# Patient Record
Sex: Female | Born: 1985 | Race: Black or African American | Hispanic: No | Marital: Married | Smoking: Never smoker
Health system: Southern US, Community
[De-identification: ages and names within clinical notes are randomized; demographics above are authoritative.]

## PROBLEM LIST (undated history)

## (undated) ENCOUNTER — Inpatient Hospital Stay (HOSPITAL_COMMUNITY): Payer: Self-pay

## (undated) DIAGNOSIS — R87619 Unspecified abnormal cytological findings in specimens from cervix uteri: Secondary | ICD-10-CM

## (undated) DIAGNOSIS — Z9289 Personal history of other medical treatment: Secondary | ICD-10-CM

## (undated) DIAGNOSIS — Z8619 Personal history of other infectious and parasitic diseases: Secondary | ICD-10-CM

## (undated) DIAGNOSIS — E041 Nontoxic single thyroid nodule: Secondary | ICD-10-CM

## (undated) DIAGNOSIS — IMO0002 Reserved for concepts with insufficient information to code with codable children: Secondary | ICD-10-CM

## (undated) HISTORY — PX: GYNECOLOGIC CRYOSURGERY: SHX857

## (undated) HISTORY — DX: Personal history of other infectious and parasitic diseases: Z86.19

## (undated) HISTORY — DX: Unspecified abnormal cytological findings in specimens from cervix uteri: R87.619

## (undated) HISTORY — PX: LEEP: SHX91

## (undated) HISTORY — DX: Personal history of other medical treatment: Z92.89

## (undated) HISTORY — DX: Nontoxic single thyroid nodule: E04.1

## (undated) HISTORY — PX: WISDOM TOOTH EXTRACTION: SHX21

## (undated) HISTORY — DX: Reserved for concepts with insufficient information to code with codable children: IMO0002

---

## 2008-10-13 ENCOUNTER — Emergency Department (HOSPITAL_COMMUNITY): Admission: EM | Admit: 2008-10-13 | Discharge: 2008-10-13 | Payer: Self-pay | Admitting: Emergency Medicine

## 2011-01-10 LAB — URINALYSIS, ROUTINE W REFLEX MICROSCOPIC
Hgb urine dipstick: NEGATIVE
Nitrite: NEGATIVE
Protein, ur: NEGATIVE mg/dL
Specific Gravity, Urine: 1.026 (ref 1.005–1.030)
Urobilinogen, UA: 1 mg/dL (ref 0.0–1.0)

## 2011-01-10 LAB — DIFFERENTIAL
Basophils Absolute: 0 10*3/uL (ref 0.0–0.1)
Lymphocytes Relative: 20 % (ref 12–46)
Monocytes Absolute: 0.4 10*3/uL (ref 0.1–1.0)
Neutro Abs: 3.9 10*3/uL (ref 1.7–7.7)
Neutrophils Relative %: 72 % (ref 43–77)

## 2011-01-10 LAB — CBC
HCT: 39.2 % (ref 36.0–46.0)
MCV: 91 fL (ref 78.0–100.0)
Platelets: 275 10*3/uL (ref 150–400)
RDW: 12.7 % (ref 11.5–15.5)

## 2011-01-10 LAB — URINE MICROSCOPIC-ADD ON

## 2011-01-10 LAB — COMPREHENSIVE METABOLIC PANEL
Albumin: 4.1 g/dL (ref 3.5–5.2)
BUN: 11 mg/dL (ref 6–23)
Chloride: 106 mEq/L (ref 96–112)
Creatinine, Ser: 1.07 mg/dL (ref 0.4–1.2)
Glucose, Bld: 91 mg/dL (ref 70–99)
Total Bilirubin: 0.6 mg/dL (ref 0.3–1.2)
Total Protein: 6.9 g/dL (ref 6.0–8.3)

## 2011-03-02 ENCOUNTER — Other Ambulatory Visit (HOSPITAL_COMMUNITY)
Admission: RE | Admit: 2011-03-02 | Discharge: 2011-03-02 | Disposition: A | Discharge status: Home / Self Care | Payer: PRIVATE HEALTH INSURANCE | Source: Ambulatory Visit | Attending: Obstetrics and Gynecology | Admitting: Obstetrics and Gynecology

## 2011-03-02 DIAGNOSIS — Z113 Encounter for screening for infections with a predominantly sexual mode of transmission: Secondary | ICD-10-CM | POA: Insufficient documentation

## 2011-03-02 DIAGNOSIS — Z01419 Encounter for gynecological examination (general) (routine) without abnormal findings: Secondary | ICD-10-CM | POA: Insufficient documentation

## 2011-09-21 ENCOUNTER — Other Ambulatory Visit: Payer: Self-pay | Admitting: Obstetrics and Gynecology

## 2011-09-21 ENCOUNTER — Ambulatory Visit (HOSPITAL_COMMUNITY)
Admission: RE | Admit: 2011-09-21 | Discharge: 2011-09-21 | Disposition: A | Discharge status: Home / Self Care | Payer: PRIVATE HEALTH INSURANCE | Source: Ambulatory Visit | Attending: Obstetrics and Gynecology | Admitting: Obstetrics and Gynecology

## 2011-09-21 DIAGNOSIS — Z3689 Encounter for other specified antenatal screening: Secondary | ICD-10-CM | POA: Insufficient documentation

## 2011-09-21 DIAGNOSIS — O99891 Other specified diseases and conditions complicating pregnancy: Secondary | ICD-10-CM | POA: Insufficient documentation

## 2011-09-21 DIAGNOSIS — R1032 Left lower quadrant pain: Secondary | ICD-10-CM | POA: Insufficient documentation

## 2011-10-19 ENCOUNTER — Ambulatory Visit (HOSPITAL_COMMUNITY)
Admission: RE | Admit: 2011-10-19 | Discharge: 2011-10-19 | Disposition: A | Discharge status: Home / Self Care | Payer: PRIVATE HEALTH INSURANCE | Source: Ambulatory Visit | Attending: Obstetrics and Gynecology | Admitting: Obstetrics and Gynecology

## 2011-10-19 ENCOUNTER — Other Ambulatory Visit (HOSPITAL_COMMUNITY): Payer: Self-pay | Admitting: Obstetrics and Gynecology

## 2011-10-19 ENCOUNTER — Other Ambulatory Visit: Payer: Self-pay

## 2011-10-19 DIAGNOSIS — R58 Hemorrhage, not elsewhere classified: Secondary | ICD-10-CM

## 2011-10-19 DIAGNOSIS — Z3689 Encounter for other specified antenatal screening: Secondary | ICD-10-CM | POA: Insufficient documentation

## 2011-10-20 ENCOUNTER — Other Ambulatory Visit: Payer: Self-pay | Admitting: Obstetrics and Gynecology

## 2011-10-20 DIAGNOSIS — E049 Nontoxic goiter, unspecified: Secondary | ICD-10-CM

## 2011-10-21 ENCOUNTER — Other Ambulatory Visit: Payer: Self-pay | Admitting: Obstetrics and Gynecology

## 2011-10-21 DIAGNOSIS — Z3689 Encounter for other specified antenatal screening: Secondary | ICD-10-CM

## 2011-10-21 DIAGNOSIS — R935 Abnormal findings on diagnostic imaging of other abdominal regions, including retroperitoneum: Secondary | ICD-10-CM

## 2011-10-26 ENCOUNTER — Other Ambulatory Visit: Payer: Self-pay | Admitting: Obstetrics and Gynecology

## 2011-10-26 ENCOUNTER — Other Ambulatory Visit: Payer: PRIVATE HEALTH INSURANCE

## 2011-10-26 DIAGNOSIS — O358XX0 Maternal care for other (suspected) fetal abnormality and damage, not applicable or unspecified: Secondary | ICD-10-CM

## 2011-10-28 ENCOUNTER — Ambulatory Visit (HOSPITAL_COMMUNITY)
Admission: RE | Admit: 2011-10-28 | Discharge: 2011-10-28 | Disposition: A | Discharge status: Home / Self Care | Payer: PRIVATE HEALTH INSURANCE | Source: Ambulatory Visit | Attending: Obstetrics and Gynecology | Admitting: Obstetrics and Gynecology

## 2011-10-28 ENCOUNTER — Other Ambulatory Visit: Payer: Self-pay | Admitting: Obstetrics and Gynecology

## 2011-10-28 ENCOUNTER — Encounter (HOSPITAL_COMMUNITY): Payer: Self-pay

## 2011-10-28 ENCOUNTER — Other Ambulatory Visit: Payer: Self-pay

## 2011-10-28 DIAGNOSIS — O358XX0 Maternal care for other (suspected) fetal abnormality and damage, not applicable or unspecified: Secondary | ICD-10-CM | POA: Insufficient documentation

## 2011-10-28 DIAGNOSIS — Z363 Encounter for antenatal screening for malformations: Secondary | ICD-10-CM | POA: Insufficient documentation

## 2011-10-28 DIAGNOSIS — Z1389 Encounter for screening for other disorder: Secondary | ICD-10-CM | POA: Insufficient documentation

## 2011-11-02 ENCOUNTER — Ambulatory Visit
Admission: RE | Admit: 2011-11-02 | Discharge: 2011-11-02 | Disposition: A | Discharge status: Home / Self Care | Payer: PRIVATE HEALTH INSURANCE | Source: Ambulatory Visit | Attending: Obstetrics and Gynecology | Admitting: Obstetrics and Gynecology

## 2011-11-02 DIAGNOSIS — E049 Nontoxic goiter, unspecified: Secondary | ICD-10-CM

## 2011-11-08 ENCOUNTER — Telehealth (HOSPITAL_COMMUNITY): Payer: Self-pay | Admitting: MS"

## 2011-11-08 NOTE — Telephone Encounter (Signed)
Left message for patient to return call.

## 2011-11-08 NOTE — Telephone Encounter (Signed)
Called [name] to discuss her MaterniT21, cell free fetal DNA testing.  We reviewed that these are within normal limits for chromosomes 21, 18 and 13.  We reviewed that this testing identifies > 99% of pregnancies with trisomy 64 and trisomy 66 and >91% with trisomy 14; the false positive rate is <0.1% for all conditions.  She understands that this testing does not identify all genetic conditions.  All questions were answered to her satisfaction, she was encouraged to call with additional questions or concerns.  Quinn Plowman, MS Patent attorney

## 2011-11-11 ENCOUNTER — Ambulatory Visit (HOSPITAL_COMMUNITY): Admission: RE | Admit: 2011-11-11 | Payer: PRIVATE HEALTH INSURANCE | Source: Ambulatory Visit

## 2012-04-09 LAB — OB RESULTS CONSOLE HEPATITIS B SURFACE ANTIGEN: Hepatitis B Surface Ag: NEGATIVE

## 2012-04-09 LAB — OB RESULTS CONSOLE RUBELLA ANTIBODY, IGM: Rubella: IMMUNE

## 2012-04-09 LAB — OB RESULTS CONSOLE ABO/RH

## 2012-04-09 LAB — OB RESULTS CONSOLE ANTIBODY SCREEN: Antibody Screen: NEGATIVE

## 2012-04-18 ENCOUNTER — Other Ambulatory Visit (HOSPITAL_COMMUNITY)
Admission: RE | Admit: 2012-04-18 | Discharge: 2012-04-18 | Disposition: A | Discharge status: Home / Self Care | Payer: BC Managed Care – PPO | Source: Ambulatory Visit | Attending: Obstetrics and Gynecology | Admitting: Obstetrics and Gynecology

## 2012-04-18 ENCOUNTER — Other Ambulatory Visit: Payer: Self-pay | Admitting: Obstetrics and Gynecology

## 2012-04-18 DIAGNOSIS — O09299 Supervision of pregnancy with other poor reproductive or obstetric history, unspecified trimester: Secondary | ICD-10-CM

## 2012-04-18 DIAGNOSIS — Z3682 Encounter for antenatal screening for nuchal translucency: Secondary | ICD-10-CM

## 2012-04-18 DIAGNOSIS — Z01419 Encounter for gynecological examination (general) (routine) without abnormal findings: Secondary | ICD-10-CM | POA: Insufficient documentation

## 2012-04-18 DIAGNOSIS — Z113 Encounter for screening for infections with a predominantly sexual mode of transmission: Secondary | ICD-10-CM | POA: Insufficient documentation

## 2012-04-18 LAB — OB RESULTS CONSOLE GC/CHLAMYDIA
Chlamydia: NEGATIVE
Gonorrhea: NEGATIVE

## 2012-05-02 ENCOUNTER — Encounter (HOSPITAL_COMMUNITY): Payer: Self-pay

## 2012-05-02 ENCOUNTER — Ambulatory Visit (HOSPITAL_COMMUNITY)
Admission: RE | Admit: 2012-05-02 | Discharge: 2012-05-02 | Disposition: A | Discharge status: Home / Self Care | Payer: BC Managed Care – PPO | Source: Ambulatory Visit | Attending: Obstetrics and Gynecology | Admitting: Obstetrics and Gynecology

## 2012-05-02 ENCOUNTER — Other Ambulatory Visit: Payer: Self-pay

## 2012-05-02 VITALS — BP 129/84 | HR 100 | Wt 190.0 lb

## 2012-05-02 DIAGNOSIS — Z3689 Encounter for other specified antenatal screening: Secondary | ICD-10-CM | POA: Insufficient documentation

## 2012-05-02 DIAGNOSIS — Z3682 Encounter for antenatal screening for nuchal translucency: Secondary | ICD-10-CM

## 2012-05-02 DIAGNOSIS — O3510X0 Maternal care for (suspected) chromosomal abnormality in fetus, unspecified, not applicable or unspecified: Secondary | ICD-10-CM | POA: Insufficient documentation

## 2012-05-02 DIAGNOSIS — O351XX0 Maternal care for (suspected) chromosomal abnormality in fetus, not applicable or unspecified: Secondary | ICD-10-CM | POA: Insufficient documentation

## 2012-05-02 DIAGNOSIS — O09299 Supervision of pregnancy with other poor reproductive or obstetric history, unspecified trimester: Secondary | ICD-10-CM

## 2012-05-02 NOTE — Progress Notes (Signed)
Patient seen today  for ultrasound.  See full report in [facility].  Alpha Gula, MD  Single IUP at 12 2/7 weeks NT of 1.2 mm noted.  Nasal bone was visualized. First trimester screen performed as above.  Recommend follow up ultrasound in 6 weeks for detailed anatomy.

## 2012-06-13 ENCOUNTER — Ambulatory Visit (HOSPITAL_COMMUNITY)
Admission: RE | Admit: 2012-06-13 | Discharge: 2012-06-13 | Disposition: A | Discharge status: Home / Self Care | Payer: BC Managed Care – PPO | Source: Ambulatory Visit | Attending: Obstetrics and Gynecology | Admitting: Obstetrics and Gynecology

## 2012-06-13 VITALS — BP 120/76 | HR 104 | Wt 188.0 lb

## 2012-06-13 DIAGNOSIS — O09299 Supervision of pregnancy with other poor reproductive or obstetric history, unspecified trimester: Secondary | ICD-10-CM | POA: Insufficient documentation

## 2012-06-13 DIAGNOSIS — Z363 Encounter for antenatal screening for malformations: Secondary | ICD-10-CM | POA: Insufficient documentation

## 2012-06-13 DIAGNOSIS — O358XX0 Maternal care for other (suspected) fetal abnormality and damage, not applicable or unspecified: Secondary | ICD-10-CM | POA: Insufficient documentation

## 2012-06-13 DIAGNOSIS — Z1389 Encounter for screening for other disorder: Secondary | ICD-10-CM | POA: Insufficient documentation

## 2012-06-13 NOTE — Progress Notes (Signed)
Single IUP at 17 5/7 weeks Normal detailed fetal anatomy; however, somewhat limited views of the fetal heart and face were obtained due to fetal position No markers associated with aneuploidy were noted Normal amniotic fluid volume  Recommend follow up ultrasound in 4 weeks to reevalaute fetal anatomy.

## 2012-06-22 ENCOUNTER — Inpatient Hospital Stay (HOSPITAL_COMMUNITY)
Admission: AD | Admit: 2012-06-22 | Discharge: 2012-06-22 | Disposition: A | Discharge status: Home / Self Care | Payer: BC Managed Care – PPO | Source: Ambulatory Visit | Attending: Obstetrics and Gynecology | Admitting: Obstetrics and Gynecology

## 2012-06-22 ENCOUNTER — Encounter (HOSPITAL_COMMUNITY): Payer: Self-pay

## 2012-06-22 DIAGNOSIS — K5900 Constipation, unspecified: Secondary | ICD-10-CM | POA: Insufficient documentation

## 2012-06-22 DIAGNOSIS — K649 Unspecified hemorrhoids: Secondary | ICD-10-CM

## 2012-06-22 DIAGNOSIS — O228X9 Other venous complications in pregnancy, unspecified trimester: Secondary | ICD-10-CM | POA: Insufficient documentation

## 2012-06-22 LAB — URINALYSIS, ROUTINE W REFLEX MICROSCOPIC
Bilirubin Urine: NEGATIVE
Hgb urine dipstick: NEGATIVE
Nitrite: NEGATIVE
Protein, ur: NEGATIVE mg/dL
Specific Gravity, Urine: 1.025 (ref 1.005–1.030)
Urobilinogen, UA: 0.2 mg/dL (ref 0.0–1.0)

## 2012-06-22 MED ORDER — HYDROCORTISONE ACETATE 25 MG RE SUPP: 25.0000 mg | Freq: Two times a day (BID) | RECTAL | Status: DC

## 2012-06-22 NOTE — MAU Provider Note (Signed)
Chief Complaint: Rectal Bleeding   First Provider Initiated Contact with Patient 06/22/12 1313     SUBJECTIVE HPI: Jennifer Mayer is a 26 y.o. G2P0010 at [redacted]w[redacted]d by LMP who presents to MAU reporting first episode today of bright red blood coating stool. Denies straining or hard stools but is on Dulcolax for constipation. Unsure if her bleeding source was vaginal or rectal. She states she and her physician areconcerned because of her history of 17 week pregnancy loss. Denies any irritative vaginal discharge, leaking or other episodes of vaginal bleeding other than slight spotting in early first trimester. Denies abdominal pain.  Past Medical History  Diagnosis Date  . Headache    OB History    Grav Para Term Preterm Abortions TAB SAB Ect Mult Living   2 0 0 0 1 0 1 0 0 0      # Outc Date GA Lbr Len/2nd Wgt Sex Del Anes PTL Lv   1 SAB            2 CUR              Past Surgical History  Procedure Date  . Wisdom tooth extraction   . Leep    History   Social History  . Marital Status: Married    Spouse Name: N/A    Number of Children: N/A  . Years of Education: N/A   Occupational History  . Not on file.   Social History Main Topics  . Smoking status: Never Smoker   . Smokeless tobacco: Never Used  . Alcohol Use: No  . Drug Use: No  . Sexually Active: Yes    Birth Control/ Protection: None   Other Topics Concern  . Not on file   Social History Narrative  . No narrative on file   No current facility-administered medications on file prior to encounter.   Current Outpatient Prescriptions on File Prior to Encounter  Medication Sig Dispense Refill  . acetaminophen-codeine (TYLENOL #3) 300-30 MG per tablet       . ondansetron (ZOFRAN-ODT) 8 MG disintegrating tablet       . PRENATAL VITAMINS PO Take by mouth.       Allergies  Allergen Reactions  . Flagyl (Metronidazole Hcl) Hives  . Morphine And Related Itching    Severe    ROS: Pertinent items in  HPI  OBJECTIVE Blood pressure 108/67, pulse 84, temperature 97.8 F (36.6 C), temperature source Oral, resp. rate 16, height 5\' 9"  (1.753 m), weight 85.446 kg (188 lb 6 oz), last menstrual period 02/06/2012, not currently breastfeeding. GENERAL: Well-developed, well-nourished female in no acute distress.  HEENT: Normocephalic HEART: normal rate RESP: normal effort ABDOMEN: Soft, non-tender, S=D. Normal FHR by DT EXTREMITIES: Nontender, no edema NEURO: Alert and oriented CX: posterior/ long closed/high; no blood Rectal: small fleshy 1 cm hemorrhoidal tag at anal verge, no internal hemorrhoid palpable, no blood   LAB RESULTS Results for orders placed during the hospital encounter of 06/22/12 (from the past 24 hour(s))  URINALYSIS, ROUTINE W REFLEX MICROSCOPIC     Status: Abnormal   Collection Time   06/22/12 12:05 PM      Component Value Range   Color, Urine YELLOW  YELLOW   APPearance HAZY (*) CLEAR   Specific Gravity, Urine 1.025  1.005 - 1.030   pH 6.5  5.0 - 8.0   Glucose, UA NEGATIVE  NEGATIVE mg/dL   Hgb urine dipstick NEGATIVE  NEGATIVE   Bilirubin Urine NEGATIVE  NEGATIVE  Ketones, ur NEGATIVE  NEGATIVE mg/dL   Protein, ur NEGATIVE  NEGATIVE mg/dL   Urobilinogen, UA 0.2  0.0 - 1.0 mg/dL   Nitrite NEGATIVE  NEGATIVE   Leukocytes, UA NEGATIVE  NEGATIVE    ASSESSMENT 1. Bleeding hemorrhoid   Constipation G2P0010 at [redacted]w[redacted]d  PLAN Discharge home. Advised Anusol HC and sitz, Tucks. Continue Dulcolax and avoid Zofran.  AVS on hemorrhoids and constipation    Medication List     As of 06/22/2012  2:21 PM    TAKE these medications         acetaminophen-codeine 300-30 MG per tablet   Commonly known as: TYLENOL #3      hydrocortisone 25 MG suppository   Commonly known as: ANUSOL-HC   Place 1 suppository (25 mg total) rectally 2 (two) times daily.      ondansetron 8 MG disintegrating tablet   Commonly known as: ZOFRAN-ODT      PRENATAL VITAMINS PO   Take by mouth.             Follow-up Information    Please follow up. (keep her scheduled appointment with Dr. Idamae Schuller)          Danae Orleans, CNM 06/22/2012  1:28 PM

## 2012-06-22 NOTE — MAU Note (Signed)
Pt states noted blood in her stool this am, hx constipation, taking colace. Has prior hx of miscarriage that began the same way, so pt/spouse are concerned.

## 2012-06-22 NOTE — MAU Note (Signed)
Pt d/c'd by Buena Irish, RN

## 2012-07-11 ENCOUNTER — Ambulatory Visit (HOSPITAL_COMMUNITY)
Admission: RE | Admit: 2012-07-11 | Discharge: 2012-07-11 | Disposition: A | Discharge status: Home / Self Care | Payer: BC Managed Care – PPO | Source: Ambulatory Visit | Attending: Obstetrics and Gynecology | Admitting: Obstetrics and Gynecology

## 2012-07-11 ENCOUNTER — Encounter (HOSPITAL_COMMUNITY): Payer: Self-pay

## 2012-07-11 DIAGNOSIS — Z363 Encounter for antenatal screening for malformations: Secondary | ICD-10-CM | POA: Insufficient documentation

## 2012-07-11 DIAGNOSIS — O09299 Supervision of pregnancy with other poor reproductive or obstetric history, unspecified trimester: Secondary | ICD-10-CM | POA: Insufficient documentation

## 2012-07-11 DIAGNOSIS — O358XX0 Maternal care for other (suspected) fetal abnormality and damage, not applicable or unspecified: Secondary | ICD-10-CM | POA: Insufficient documentation

## 2012-07-11 DIAGNOSIS — Z1389 Encounter for screening for other disorder: Secondary | ICD-10-CM | POA: Insufficient documentation

## 2012-07-11 NOTE — Progress Notes (Signed)
Jennifer Mayer  was seen today for an ultrasound appointment.  See full report in [facility].  Alpha Gula, MD  Single IUP at 22 2/7 weeks Normal detailed fetal anatomy No markers associated with aneuploidy noted Normal amniotic fluid volume  Recommend follow up ultrasounds as clinically indicated

## 2012-08-18 ENCOUNTER — Inpatient Hospital Stay (HOSPITAL_COMMUNITY)
Admission: AD | Admit: 2012-08-18 | Discharge: 2012-08-18 | Disposition: A | Discharge status: Home / Self Care | Payer: BC Managed Care – PPO | Source: Ambulatory Visit | Attending: Obstetrics and Gynecology | Admitting: Obstetrics and Gynecology

## 2012-08-18 ENCOUNTER — Encounter (HOSPITAL_COMMUNITY): Payer: Self-pay | Admitting: Family

## 2012-08-18 DIAGNOSIS — Z3689 Encounter for other specified antenatal screening: Secondary | ICD-10-CM

## 2012-08-18 DIAGNOSIS — O368190 Decreased fetal movements, unspecified trimester, not applicable or unspecified: Secondary | ICD-10-CM | POA: Insufficient documentation

## 2012-08-18 NOTE — MAU Note (Signed)
Pt reports last good fetal movement on Wednesday. Called office this morning and was told to come in; has felt movement since arrival.

## 2012-08-18 NOTE — MAU Note (Signed)
Pt reports she has not felt baby movement as much in the  Past 3 days. . Denies any vaginal bleeding reports occasional mild cramping she attributes to starting on Iron supp.

## 2012-08-18 NOTE — MAU Provider Note (Signed)
  History     CSN: [id number]  Arrival date and time: 08/18/12 1120   First Provider Initiated Contact with Patient 08/18/12 1217      Chief Complaint  Patient presents with  . Decreased Fetal Movement   HPI 26 y.o. G2P0010 at [redacted]w[redacted]d with decreased fetal movement x 3 days. No pain, bleeding, LOF. Uncomplicated prenatal course.    Past Medical History  Diagnosis Date  . Headache     Past Surgical History  Procedure Date  . Wisdom tooth extraction   . Leep     Family History  Problem Relation Age of Onset  . Other Neg Hx     History  Substance Use Topics  . Smoking status: Never Smoker   . Smokeless tobacco: Never Used  . Alcohol Use: No    Allergies:  Allergies  Allergen Reactions  . Flagyl (Metronidazole Hcl) Hives  . Morphine And Related Itching    Severe    Prescriptions prior to admission  Medication Sig Dispense Refill  . acetaminophen (TYLENOL) 500 MG tablet Take 500 mg by mouth every 6 (six) hours as needed. pain      . Fe Fum-FePoly-Vit C-Vit B3 (INTEGRA PO) Take 1 tablet by mouth.      . Prenatal Vit-Fe Fumarate-FA (PRENATAL MULTIVITAMIN) TABS Take 1 tablet by mouth daily.        Review of Systems  Constitutional: Negative.   Respiratory: Negative.   Cardiovascular: Negative.   Gastrointestinal: Negative for nausea, vomiting, abdominal pain, diarrhea and constipation.  Genitourinary: Negative for dysuria, urgency, frequency, hematuria and flank pain.       Negative for vaginal bleeding, cramping/contractions  Musculoskeletal: Negative.   Neurological: Negative.   Psychiatric/Behavioral: Negative.    Physical Exam   Blood pressure 115/66, pulse 91, temperature 97.2 F (36.2 C), temperature source Oral, resp. rate 18, height 5\' 9"  (1.753 m), weight 193 lb 9.6 oz (87.816 kg), last menstrual period 02/06/2012.  Physical Exam  Nursing note and vitals reviewed. Constitutional: She is oriented to person, place, and time. She appears  well-developed and well-nourished. No distress.  Cardiovascular: Normal rate.   Respiratory: Effort normal.  GI: Soft. There is no tenderness.  Musculoskeletal: Normal range of motion.  Neurological: She is alert and oriented to person, place, and time.  Skin: Skin is dry.  Psychiatric: She has a normal mood and affect.   EFM: 150, mod variability, 10 x 10 accels, no decels, TOCO: quiet MAU Course  Procedures   Assessment and Plan   1. NST (non-stress test) reactive       Medication List     As of 08/18/2012  6:32 PM    CONTINUE taking these medications         acetaminophen 500 MG tablet   Commonly known as: TYLENOL      INTEGRA PO      prenatal multivitamin Tabs            Follow-up Information    Follow up with Geryl Rankins, MD. (as scheduled)    Contact information:   [address], [address] [place] [address] [phone]            Jennifer Mayer 08/18/2012, 12:18 PM

## 2012-08-21 NOTE — MAU Provider Note (Signed)
i dsicussed plan of care and agree with above

## 2012-11-09 ENCOUNTER — Encounter (HOSPITAL_COMMUNITY): Payer: Self-pay | Admitting: *Deleted

## 2012-11-09 ENCOUNTER — Inpatient Hospital Stay (HOSPITAL_COMMUNITY)
Admission: AD | Admit: 2012-11-09 | Discharge: 2012-11-09 | Disposition: A | Discharge status: Home / Self Care | Payer: BC Managed Care – PPO | Source: Ambulatory Visit | Attending: Obstetrics and Gynecology | Admitting: Obstetrics and Gynecology

## 2012-11-09 DIAGNOSIS — Z3480 Encounter for supervision of other normal pregnancy, unspecified trimester: Secondary | ICD-10-CM

## 2012-11-09 DIAGNOSIS — O479 False labor, unspecified: Secondary | ICD-10-CM

## 2012-11-09 NOTE — MAU Note (Signed)
Pt not able to make OB appt yesterday due to snow.  Pt came to MAU to be checked out.  No complaints at this time.  Has a hx of a lose at 4 months.  No vaginal bleeding or ROM.  Reports good fetal movement.

## 2012-11-09 NOTE — MAU Provider Note (Signed)
  History     CSN: [id number]  Arrival date and time: 11/09/12 1319   First Provider Initiated Contact with Patient 11/09/12 1353      No chief complaint on file.  HPI Jennifer Mayer is 27 y.o. G2P0010 [redacted]w[redacted]d weeks presenting with request for a check up because she missed her appointment due to weather yesterday and the office is closed today due to weather.  Denies vaginal bleeding,contraction, leaking of fluid.  + Fetal movement.  States she called the hospital and was told to come in because she is close to term.  Uncomplicated pregnancy.  Last week was 1 cm dilated in the office. Patient of Dr. Dion Body.      Past Medical History  Diagnosis Date  . Headache     Past Surgical History  Procedure Laterality Date  . Wisdom tooth extraction    . Leep      Family History  Problem Relation Age of Onset  . Other Neg Hx     History  Substance Use Topics  . Smoking status: Never Smoker   . Smokeless tobacco: Never Used  . Alcohol Use: No    Allergies:  Allergies  Allergen Reactions  . Flagyl (Metronidazole Hcl) Hives  . Morphine And Related Itching    Severe    Prescriptions prior to admission  Medication Sig Dispense Refill  . acetaminophen (TYLENOL) 500 MG tablet Take 500 mg by mouth every 6 (six) hours as needed. pain      . Fe Fum-FePoly-Vit C-Vit B3 (INTEGRA PO) Take 1 tablet by mouth.      . Prenatal Vit-Fe Fumarate-FA (PRENATAL MULTIVITAMIN) TABS Take 1 tablet by mouth daily.        Review of Systems  Constitutional: Negative.   HENT: Negative.   Respiratory: Negative.   Cardiovascular: Negative.   Gastrointestinal: Negative.  Negative for abdominal pain.  Genitourinary: Negative for dysuria and urgency.       Negative for vaginal bleeding, leaking of fluid,or contractions. + Fetal Movement   Physical Exam   Blood pressure 121/75, pulse 98, temperature 98.2 F (36.8 C), temperature source Oral, resp. rate 18, height 5\' 9"  (1.753 m), weight 201 lb 12.8  oz (91.536 kg), last menstrual period 02/06/2012, SpO2 100.00%.  Physical Exam  Constitutional: She is oriented to person, place, and time. She appears well-developed and well-nourished. No distress.  HENT:  Head: Normocephalic.  Neck: Normal range of motion.  Cardiovascular: Normal rate.   Respiratory: Effort normal.  Genitourinary:  Cervical exam by Lawson Fiscal, RN--fingertip and high  Neurological: She is alert and oriented to person, place, and time.  Skin: Skin is warm and dry.  Psychiatric: She has a normal mood and affect. Her behavior is normal.    MAU Course  Procedures  FMS Reactive  MDM 14:00 MSE and cervical exam,FMS findings discussed with Dr. Richardson Dopp.  May be discharged to home  Assessment and Plan  A: labor ruled out at [redacted]w[redacted]d gestation  P:  Keep scheduled appointment for follow up. [name],EVE M 11/09/2012, 1:54 PM

## 2012-11-19 ENCOUNTER — Telehealth (HOSPITAL_COMMUNITY): Payer: Self-pay | Admitting: *Deleted

## 2012-11-19 ENCOUNTER — Encounter (HOSPITAL_COMMUNITY): Payer: Self-pay | Admitting: *Deleted

## 2012-11-19 NOTE — Telephone Encounter (Signed)
Preadmission screen  

## 2012-11-20 ENCOUNTER — Encounter (HOSPITAL_COMMUNITY): Payer: Self-pay | Admitting: *Deleted

## 2012-11-20 ENCOUNTER — Inpatient Hospital Stay (HOSPITAL_COMMUNITY): Payer: BC Managed Care – PPO | Admitting: Anesthesiology

## 2012-11-20 ENCOUNTER — Encounter (HOSPITAL_COMMUNITY): Payer: Self-pay | Admitting: Anesthesiology

## 2012-11-20 ENCOUNTER — Inpatient Hospital Stay (HOSPITAL_COMMUNITY)
Admission: RE | Admit: 2012-11-20 | Discharge: 2012-11-23 | DRG: 373 | Disposition: A | Discharge status: Home / Self Care | Payer: BC Managed Care – PPO | Source: Ambulatory Visit | Attending: Obstetrics and Gynecology | Admitting: Obstetrics and Gynecology

## 2012-11-20 ENCOUNTER — Inpatient Hospital Stay (HOSPITAL_COMMUNITY)
Admission: AD | Admit: 2012-11-20 | Discharge: 2012-11-20 | Disposition: A | Discharge status: Home / Self Care | Payer: BC Managed Care – PPO | Source: Ambulatory Visit | Attending: Obstetrics and Gynecology | Admitting: Obstetrics and Gynecology

## 2012-11-20 ENCOUNTER — Other Ambulatory Visit: Payer: Self-pay | Admitting: Obstetrics and Gynecology

## 2012-11-20 ENCOUNTER — Encounter (HOSPITAL_COMMUNITY): Payer: Self-pay

## 2012-11-20 DIAGNOSIS — O99892 Other specified diseases and conditions complicating childbirth: Secondary | ICD-10-CM | POA: Diagnosis present

## 2012-11-20 DIAGNOSIS — O479 False labor, unspecified: Secondary | ICD-10-CM | POA: Insufficient documentation

## 2012-11-20 DIAGNOSIS — Z22330 Carrier of Group B streptococcus: Secondary | ICD-10-CM

## 2012-11-20 LAB — CBC
HCT: 33.6 % — ABNORMAL LOW (ref 36.0–46.0)
MCHC: 32.7 g/dL (ref 30.0–36.0)
MCV: 92.1 fL (ref 78.0–100.0)
Platelets: 209 10*3/uL (ref 150–400)
RDW: 14.3 % (ref 11.5–15.5)

## 2012-11-20 MED ORDER — MISOPROSTOL 25 MCG QUARTER TABLET
25.0000 ug | ORAL_TABLET | ORAL | Status: DC | PRN
  Administered 2012-11-20: 25 ug via VAGINAL
  Filled 2012-11-20: qty 0.25

## 2012-11-20 MED ORDER — OXYTOCIN BOLUS FROM INFUSION: 500.0000 mL | INTRAVENOUS | Status: DC

## 2012-11-20 MED ORDER — OXYTOCIN 40 UNITS IN LACTATED RINGERS INFUSION - SIMPLE MED: 1.0000 m[IU]/min | INTRAVENOUS | Status: DC

## 2012-11-20 MED ORDER — LIDOCAINE HCL (PF) 1 % IJ SOLN
30.0000 mL | INTRAMUSCULAR | Status: DC | PRN
  Filled 2012-11-20: qty 30

## 2012-11-20 MED ORDER — DEXTROSE 5 % IV SOLN
2.5000 10*6.[IU] | INTRAVENOUS | Status: DC
  Administered 2012-11-21: 2.5 10*6.[IU] via INTRAVENOUS
  Filled 2012-11-20 (×5): qty 2.5

## 2012-11-20 MED ORDER — OXYCODONE-ACETAMINOPHEN 5-325 MG PO TABS: 1.0000 | ORAL_TABLET | ORAL | Status: DC | PRN

## 2012-11-20 MED ORDER — LACTATED RINGERS IV SOLN
INTRAVENOUS | Status: DC
  Administered 2012-11-20: 18:00:00 via INTRAVENOUS

## 2012-11-20 MED ORDER — ZOLPIDEM TARTRATE 5 MG PO TABS: 5.0000 mg | ORAL_TABLET | Freq: Every evening | ORAL | Status: DC | PRN

## 2012-11-20 MED ORDER — TERBUTALINE SULFATE 1 MG/ML IJ SOLN: 0.2500 mg | Freq: Once | INTRAMUSCULAR | Status: AC | PRN

## 2012-11-20 MED ORDER — PHENYLEPHRINE 40 MCG/ML (10ML) SYRINGE FOR IV PUSH (FOR BLOOD PRESSURE SUPPORT): 80.0000 ug | PREFILLED_SYRINGE | INTRAVENOUS | Status: DC | PRN

## 2012-11-20 MED ORDER — FENTANYL 2.5 MCG/ML BUPIVACAINE 1/10 % EPIDURAL INFUSION (WH - ANES)
14.0000 mL/h | INTRAMUSCULAR | Status: DC
  Administered 2012-11-20: 14 mL/h via EPIDURAL
  Filled 2012-11-20: qty 125

## 2012-11-20 MED ORDER — SODIUM BICARBONATE 8.4 % IV SOLN
INTRAVENOUS | Status: DC | PRN
  Administered 2012-11-20: 5 mL via EPIDURAL

## 2012-11-20 MED ORDER — ACETAMINOPHEN 325 MG PO TABS: 650.0000 mg | ORAL_TABLET | ORAL | Status: DC | PRN

## 2012-11-20 MED ORDER — OXYTOCIN 40 UNITS IN LACTATED RINGERS INFUSION - SIMPLE MED
62.5000 mL/h | INTRAVENOUS | Status: DC
  Administered 2012-11-21: 62.5 mL/h via INTRAVENOUS
  Filled 2012-11-20: qty 1000

## 2012-11-20 MED ORDER — EPHEDRINE 5 MG/ML INJ: 10.0000 mg | INTRAVENOUS | Status: DC | PRN

## 2012-11-20 MED ORDER — LACTATED RINGERS IV SOLN: 500.0000 mL | INTRAVENOUS | Status: DC | PRN

## 2012-11-20 MED ORDER — ONDANSETRON HCL 4 MG/2ML IJ SOLN: 4.0000 mg | Freq: Four times a day (QID) | INTRAMUSCULAR | Status: DC | PRN

## 2012-11-20 MED ORDER — PENICILLIN G POTASSIUM 5000000 UNITS IJ SOLR
5.0000 10*6.[IU] | Freq: Once | INTRAVENOUS | Status: AC
  Administered 2012-11-20: 5 10*6.[IU] via INTRAVENOUS
  Filled 2012-11-20: qty 5

## 2012-11-20 MED ORDER — IBUPROFEN 600 MG PO TABS: 600.0000 mg | ORAL_TABLET | Freq: Four times a day (QID) | ORAL | Status: DC | PRN

## 2012-11-20 MED ORDER — PHENYLEPHRINE 40 MCG/ML (10ML) SYRINGE FOR IV PUSH (FOR BLOOD PRESSURE SUPPORT)
80.0000 ug | PREFILLED_SYRINGE | INTRAVENOUS | Status: DC | PRN
  Filled 2012-11-20: qty 5

## 2012-11-20 MED ORDER — CITRIC ACID-SODIUM CITRATE 334-500 MG/5ML PO SOLN: 30.0000 mL | ORAL | Status: DC | PRN

## 2012-11-20 MED ORDER — EPHEDRINE 5 MG/ML INJ
10.0000 mg | INTRAVENOUS | Status: DC | PRN
  Filled 2012-11-20: qty 4

## 2012-11-20 MED ORDER — LACTATED RINGERS IV SOLN: 500.0000 mL | Freq: Once | INTRAVENOUS | Status: DC

## 2012-11-20 MED ORDER — DIPHENHYDRAMINE HCL 50 MG/ML IJ SOLN
12.5000 mg | INTRAMUSCULAR | Status: DC | PRN
  Administered 2012-11-21 (×2): 12.5 mg via INTRAVENOUS
  Filled 2012-11-20 (×2): qty 1

## 2012-11-20 MED ORDER — BUTORPHANOL TARTRATE 1 MG/ML IJ SOLN: 1.0000 mg | INTRAMUSCULAR | Status: DC | PRN

## 2012-11-20 NOTE — MAU Note (Signed)
Dr. Richardson Dopp notified of pt.  Orders rec'd for D/C.

## 2012-11-20 NOTE — Progress Notes (Signed)
Called Dr. Neva Seat for clarification of PCN for GBS +, MD orders to start with ROM, Labor or tomorrow am with pitocin.  Also orders if pt starts to labor, do not put in 2nd dose of cytotec and allow pt to labor.  Orders read back and verified.

## 2012-11-20 NOTE — Anesthesia Procedure Notes (Signed)

## 2012-11-20 NOTE — MAU Note (Signed)
Pt G2 P0 at 40.6wks having contractions every 15-34min.  Reports bloody show, denies leaking.

## 2012-11-20 NOTE — Anesthesia Preprocedure Evaluation (Signed)

## 2012-11-21 ENCOUNTER — Encounter (HOSPITAL_COMMUNITY): Payer: Self-pay

## 2012-11-21 LAB — CBC
HCT: 32.6 % — ABNORMAL LOW (ref 36.0–46.0)
MCHC: 32.5 g/dL (ref 30.0–36.0)
MCV: 90.8 fL (ref 78.0–100.0)
Platelets: 193 10*3/uL (ref 150–400)
RBC: 3.59 MIL/uL — ABNORMAL LOW (ref 3.87–5.11)
RDW: 14.1 % (ref 11.5–15.5)
WBC: 16.8 10*3/uL — ABNORMAL HIGH (ref 4.0–10.5)

## 2012-11-21 LAB — COMPREHENSIVE METABOLIC PANEL
AST: 29 U/L (ref 0–37)
Albumin: 2.8 g/dL — ABNORMAL LOW (ref 3.5–5.2)
BUN: 4 mg/dL — ABNORMAL LOW (ref 6–23)
Chloride: 105 mEq/L (ref 96–112)
Creatinine, Ser: 0.66 mg/dL (ref 0.50–1.10)
Glucose, Bld: 88 mg/dL (ref 70–99)
Potassium: 3.4 mEq/L — ABNORMAL LOW (ref 3.5–5.1)
Total Bilirubin: 0.5 mg/dL (ref 0.3–1.2)
Total Protein: 6.2 g/dL (ref 6.0–8.3)

## 2012-11-21 MED ORDER — ONDANSETRON HCL 4 MG/2ML IJ SOLN: 4.0000 mg | INTRAMUSCULAR | Status: DC | PRN

## 2012-11-21 MED ORDER — SIMETHICONE 80 MG PO CHEW: 80.0000 mg | CHEWABLE_TABLET | ORAL | Status: DC | PRN

## 2012-11-21 MED ORDER — ZOLPIDEM TARTRATE 5 MG PO TABS: 5.0000 mg | ORAL_TABLET | Freq: Every evening | ORAL | Status: DC | PRN

## 2012-11-21 MED ORDER — BENZOCAINE-MENTHOL 20-0.5 % EX AERO
1.0000 "application " | INHALATION_SPRAY | CUTANEOUS | Status: DC | PRN
  Administered 2012-11-21: 1 via TOPICAL
  Filled 2012-11-21: qty 56

## 2012-11-21 MED ORDER — OXYCODONE-ACETAMINOPHEN 5-325 MG PO TABS: 1.0000 | ORAL_TABLET | ORAL | Status: DC | PRN

## 2012-11-21 MED ORDER — TETANUS-DIPHTH-ACELL PERTUSSIS 5-2.5-18.5 LF-MCG/0.5 IM SUSP
0.5000 mL | Freq: Once | INTRAMUSCULAR | Status: AC
  Administered 2012-11-22: 0.5 mL via INTRAMUSCULAR
  Filled 2012-11-21: qty 0.5

## 2012-11-21 MED ORDER — IBUPROFEN 600 MG PO TABS
600.0000 mg | ORAL_TABLET | Freq: Four times a day (QID) | ORAL | Status: DC
  Administered 2012-11-21 – 2012-11-23 (×8): 600 mg via ORAL
  Filled 2012-11-21 (×8): qty 1

## 2012-11-21 MED ORDER — DIBUCAINE 1 % RE OINT: 1.0000 "application " | TOPICAL_OINTMENT | RECTAL | Status: DC | PRN

## 2012-11-21 MED ORDER — ONDANSETRON HCL 4 MG PO TABS: 4.0000 mg | ORAL_TABLET | ORAL | Status: DC | PRN

## 2012-11-21 MED ORDER — MEASLES, MUMPS & RUBELLA VAC ~~LOC~~ INJ
0.5000 mL | INJECTION | Freq: Once | SUBCUTANEOUS | Status: DC
  Filled 2012-11-21: qty 0.5

## 2012-11-21 MED ORDER — WITCH HAZEL-GLYCERIN EX PADS: 1.0000 "application " | MEDICATED_PAD | CUTANEOUS | Status: DC | PRN

## 2012-11-21 MED ORDER — MEDROXYPROGESTERONE ACETATE 150 MG/ML IM SUSP: 150.0000 mg | INTRAMUSCULAR | Status: DC | PRN

## 2012-11-21 MED ORDER — SENNOSIDES-DOCUSATE SODIUM 8.6-50 MG PO TABS
2.0000 | ORAL_TABLET | Freq: Every day | ORAL | Status: DC
  Administered 2012-11-21 – 2012-11-22 (×2): 2 via ORAL

## 2012-11-21 MED ORDER — PRENATAL MULTIVITAMIN CH
1.0000 | ORAL_TABLET | Freq: Every day | ORAL | Status: DC
  Administered 2012-11-21 – 2012-11-22 (×2): 1 via ORAL
  Filled 2012-11-21: qty 1

## 2012-11-21 MED ORDER — LANOLIN HYDROUS EX OINT: TOPICAL_OINTMENT | CUTANEOUS | Status: DC | PRN

## 2012-11-21 MED ORDER — DIPHENHYDRAMINE HCL 25 MG PO CAPS: 25.0000 mg | ORAL_CAPSULE | Freq: Four times a day (QID) | ORAL | Status: DC | PRN

## 2012-11-21 NOTE — Progress Notes (Signed)
Post Partum Day 0 Subjective: no complaints  Breast feeding.  Objective: Blood pressure 106/68, pulse 89, temperature 98.2 F (36.8 C), temperature source Oral, resp. rate 16, height 5\' 9"  (1.753 m), weight 92.897 kg (204 lb 12.8 oz), last menstrual period 02/06/2012, SpO2 98.00%, unknown if currently breastfeeding.  Physical Exam:  General: alert, cooperative and no distress Lochia: not assessed Uterine Fundus: firm Incision: not assessed DVT Evaluation: No evidence of DVT seen on physical exam. Calf/Ankle edema is present.   Recent Labs  11/20/12 1830 11/21/12 0620  HGB 11.0* 10.6*  HCT 33.6* 32.6*    Assessment/Plan: Breastfeeding  Lactation consult. Continue current management.    LOS: 1 day   Jennifer Mayer 11/21/2012, 8:27 AM

## 2012-11-21 NOTE — Anesthesia Postprocedure Evaluation (Signed)
  Anesthesia Post-op Note  Patient: Jennifer Mayer Anesthesia Post Note  Patient: Jennifer Mayer  Procedure(s) Performed: * No procedures listed *  Anesthesia type: Epidural  Patient location: Mother/Baby  Post pain: Pain level controlled  Post assessment: Post-op Vital signs reviewed  Last Vitals:  Filed Vitals:   11/21/12 0800  BP: 106/68  Pulse: 89  Temp: 36.8 C  Resp: 16    Post vital signs: Reviewed  Level of consciousness:alert  Complications: No apparent anesthesia complications

## 2012-11-21 NOTE — Progress Notes (Signed)
Delivery Note At 4:04 AM a viable female was delivered via Vaginal, Spontaneous Delivery (Presentation: Right Occiput Anterior).  APGAR: , ; weight .   Placenta status: Intact, Spontaneous.  Cord: 3 vessels with the following complications: None.    Anesthesia: Epidural  Episiotomy: None Lacerations: 2nd degree;Perineal Suture Repair: 3.0 vicryl 2 interrupted stitches Est. Blood Loss (mL): 400  Mom to postpartum.  Baby to nursery-stable.  Courtnay Petrilla E 11/21/2012, 4:39 AM

## 2012-11-22 LAB — CBC
MCH: 30.3 pg (ref 26.0–34.0)
MCHC: 32.9 g/dL (ref 30.0–36.0)
MCV: 92 fL (ref 78.0–100.0)
Platelets: 216 10*3/uL (ref 150–400)

## 2012-11-22 LAB — HEMOGLOBIN AND HEMATOCRIT, BLOOD
HCT: 30.7 % — ABNORMAL LOW (ref 36.0–46.0)
Hemoglobin: 10.1 g/dL — ABNORMAL LOW (ref 12.0–15.0)

## 2012-11-22 NOTE — Progress Notes (Signed)
Post Partum Day 1 Subjective: C/o "tailbone" pain.  Noticed when epidural started wearing off.  Only occurs when standing or sitting up and other movement.  Ibuprofen relieves pain.  6/10. Still having some difficulty with latching but improving with breast shields.  Bleeding minimal.  Objective: Blood pressure 94/60, pulse 75, temperature 97.7 F (36.5 C), temperature source Oral, resp. rate 18, height 5\' 9"  (1.753 m), weight 92.897 kg (204 lb 12.8 oz), last menstrual period 02/06/2012, SpO2 96.00%, unknown if currently breastfeeding.  Physical Exam:  General: alert, cooperative and no distress Lochia: Not assessed Uterine Fundus: firm Incision: not assessed DVT Evaluation: No evidence of DVT seen on physical exam. Calf/Ankle edema is present.   Recent Labs  11/22/12 0015 11/22/12 0528  HGB 11.0* 10.1*  HCT 33.4* 30.7*    Assessment/Plan: Plan for discharge tomorrow, Breastfeeding and Lactation consult Kpad for sacral pain.  Continue Ibuprofen. Dr. Richardson Dopp to discharge tomorrow. Discharge instructions and pp depression precautions given.   LOS: 2 days   Oneil Behney 11/22/2012, 2:01 PM

## 2012-11-23 MED ORDER — IBUPROFEN 600 MG PO TABS: 600.0000 mg | ORAL_TABLET | Freq: Four times a day (QID) | ORAL | Status: DC | PRN

## 2012-11-23 NOTE — Discharge Summary (Signed)
Obstetric Discharge Summary Reason for Admission: onset of labor Prenatal Procedures: none Intrapartum Procedures: spontaneous vaginal delivery Postpartum Procedures: none Complications-Operative and Postpartum: none Hemoglobin  Date Value Range Status  11/22/2012 10.1* 12.0 - 15.0 g/dL Final     HCT  Date Value Range Status  11/22/2012 30.7* 36.0 - 46.0 % Final    Physical Exam:  General: alert and cooperative Lochia: appropriate Uterine Fundus: firm Incision: NA DVT Evaluation: No evidence of DVT seen on physical exam.  Discharge Diagnoses: Term Pregnancy-delivered  Discharge Information: Date: 11/23/2012 Activity: pelvic rest Diet: routine Medications: PNV, Ibuprofen and Iron Condition: stable Instructions: refer to practice specific booklet Discharge to: home Follow-up Information   Follow up with Geryl Rankins, MD. Schedule an appointment as soon as possible for a visit in 6 weeks. (Postpartum check)    Contact information:   [address], [address] [place] [address] [phone]       Newborn Data: Live born female  Birth Weight: 7 lb 12.7 oz (3535 g) APGAR: 8, 9  Home with mother.  Ralph Brouwer J. 11/23/2012, 8:47 AM

## 2012-11-23 NOTE — Progress Notes (Signed)
Patient was referred for history of depression/anxiety. * Referral screened out by Clinical Social Worker because none of the following criteria appear to apply:  ~ History of anxiety/depression during this pregnancy, or of post-partum depression.  ~ Diagnosis of anxiety and/or depression within last 3 years.  ~ History of depression due to pregnancy loss/loss of child  OR * Patient's symptoms currently being treated with medication and/or therapy.  Please contact the Clinical Social Worker if needs arise, or by the patient's request. Pt reports that she is fine & not in need of CSW intervention.

## 2012-11-24 ENCOUNTER — Ambulatory Visit (HOSPITAL_COMMUNITY)
Admission: RE | Admit: 2012-11-24 | Discharge: 2012-11-24 | Disposition: A | Discharge status: Home / Self Care | Payer: BC Managed Care – PPO | Source: Ambulatory Visit | Attending: Obstetrics and Gynecology | Admitting: Obstetrics and Gynecology

## 2012-11-24 NOTE — Lactation Note (Signed)
Adult Lactation Consultation Outpatient Visit Note  Patient Name: Jennifer Mayer   Baby: Payton Emerald Date of Birth: [birth year]    DOB: [birth year] @ 0404 Gestational Age at Delivery: [redacted]w[redacted]d   BW: 7# 12.7oz Type of Delivery: Vag     Wt on day of d/c: 7# 4.6 oz        Today's weight: 7# 8.1 oz Breastfeeding History: Frequency of Breastfeeding: 7times/24 Length of Feeding: 5-15 min Voids: 3-4/day, light to medium yellow Stools: 3, dark green  Supplementing / Method: Pumping:  Type of Pump:hand pump, but painful   Frequency:  Volume:   No supplementation.   Consultation Evaluation:  Initial Feeding Assessment: Pre-feed [id number] Post-feed Weight: 3452g Amount Transferred: 48mL Comments: R breast, cross-cradle w/nipple shield (size 20)  Additional Feeding Assessment: Pre-feed [id number] Post-feed [id number] Amount Transferred:16 Comments: L breast, cross-cradle w/nipple shield (size 20)   Total Breast milk Transferred this Visit: 64mL    Follow-Up  I had received a call from Mom about her discomfort w/nursing.  Mom was describing suboptimal breastfeeding behavior on behalf of the baby (not going to the breast frequently & only feeding for about 5 min) & dark yellow urine, so I had Mom & baby come in for an impromptu O/P appt.    Mom's milk was in.  Mom had not been feeding off of R breast b/c of latch discomfort, so R breast was much fuller (nearing engorgement).  Slight scabbing noted on both nipples, R more so than L.  Axillary breast tissue also enlarged bilaterally.    Baby was weighed & found to have gained weight since d/c.  Baby's urine was a light yellow.  When baby went to the breast, she was only able to get a very shallow latch.  However, baby had still been able to "feed" b/c of Mom's copious milk production. Relatching was attempted, but b/c of Mom's short nipples, a nipple shield (size 20) was needed.  Baby's mouth was sized, as well.   Baby went to both  breasts (using the nipple shield) with ease & the total feeding time was about 45 min.  Lots of milk was noted in the shaft of the nipple shield upon release of the latch.  Time was spent w/parents focusing on signs of satiety, mechanics of a good latch, etc.  A pictorial diaper sheet was given so that parents would know what to expect in regards to output & stool changes.  A size 27 flange was provided for her hand pump, as were Comfort Gels and an extra nipple shield.  Cleaning instructions of nipple shield provided.  Parents were feeling much better by the end of the consult & already have an appt on Monday w/their pediatrician.        Lurline Hare [facility] 11/24/2012, 4:45 PM

## 2012-11-29 ENCOUNTER — Ambulatory Visit (HOSPITAL_COMMUNITY)
Admission: RE | Admit: 2012-11-29 | Discharge: 2012-11-29 | Disposition: A | Discharge status: Home / Self Care | Payer: BC Managed Care – PPO | Source: Ambulatory Visit | Attending: Obstetrics and Gynecology | Admitting: Obstetrics and Gynecology

## 2012-11-30 ENCOUNTER — Telehealth (HOSPITAL_COMMUNITY): Payer: Self-pay | Admitting: Lactation Services

## 2012-11-30 NOTE — Telephone Encounter (Signed)
Rec'd call from Mom: Mom diagnosed w/mastititis in R breast yesterday.  Baby has not taken R breast since.  Explained that milk gets saltier w/mastitits & baby may dislike taste.  Mom has also not been pumping R side, so thus, breast has become overfull   Explained to Mom that if baby will not go to R breast, then Mom needs to pump to help mastitis resolve.  Mom verbalizes understanding.  Jennifer Mayer, Jennifer Mayer, Jennifer Mayer

## 2012-12-07 ENCOUNTER — Ambulatory Visit (HOSPITAL_COMMUNITY)
Admission: RE | Admit: 2012-12-07 | Discharge: 2012-12-07 | Disposition: A | Discharge status: Home / Self Care | Payer: BC Managed Care – PPO | Source: Ambulatory Visit | Attending: Obstetrics and Gynecology | Admitting: Obstetrics and Gynecology

## 2012-12-07 NOTE — Lactation Note (Signed)
Adult Lactation Consultation Outpatient Visit Note  Patient Name: Jennifer Mayer(mother)     BABY: Jennifer Mayer Date of Birth: 08/18/1986                                   DOB: [birth year] Gestational Age at Delivery: 41 weeks             BIRTH WEIGHT: 7-12.7 Type of Delivery: NVD                                       WEIGHT TODAY: 9-3.6  Breastfeeding History: Frequency of Breastfeeding: EVERY 2-3 HOURS Length of Feeding: 15-20 MINUTES ONE SIDE Voids: QS Stools: QS  Supplementing / Method:NONE Pumping:  Type of Pump:   Frequency:PRE PUMPS RIGHT BREAST PRN TO ERECT NIPPLE  Volume:    Comments:    Consultation Evaluation:Mom and 54 week old baby here for feeding assessment due to continued painful feedings.  Patient also just finished antibiotic treatment for right sided mastitis and symptoms are resolved.  Patient was treated for positive GBS in labor.  Mom denies history of frequent vaginal yeast infections.  OB MD also gave patient a one dose diflucan due to antibiotic treatment.  Mom c/o initial latch on pain and some occasional mild burning more on right breast which is more awkward side to latch baby.  Nipples intact and left nipple slightly pink. Baby's oral exam shows good tongue mobility and some white coating on tongue and lips.  Instructed mom in infant oral care and possibility of thrush and if coating is not easily removed contact pediatrician.  Yeast instructions for care of thrush for mother and baby given to patient.  She will also call her MD if her symptoms don't resolve or worsen for longer diflucan treatment.  I observed mom latch baby to right breast using cross cradle hold.  Mom has large full breasts and baby latched with shallow latch which was painful.  I had mom break the suction and we took baby off and worked on better techniques for wide latch.  With improved techniques  baby was able to latch easily and wide and mom noticed immediate difference and no pain.  Baby nursed  actively with audible gulping and transferred 84 mls in 15 minutes.  Discussed with mom that nipple pain could possibly be the combination of shallow latch and yeast.  Initial Feeding Assessment:RIGHT BREAST X 15 MINUTES Pre-feed [id number] Post-feed [id number] Amount Transferred:84 MLS Comments:  Additional Feeding Assessment: Pre-feed Weight: Post-feed Weight: Amount Transferred: Comments:  Additional Feeding Assessment: Pre-feed Weight: Post-feed Weight: Amount Transferred: Comments:  Total Breast milk Transferred this Visit: 84 MLS Total Supplement Given: NONE  Additional Interventions:   Follow-Up WILL CALL PRN      Hansel Feinstein 12/07/2012, 4:31 PM

## 2012-12-15 NOTE — H&P (Signed)
Jennifer Mayer is a 27 y.o. female presenting for IOL due to postdates.  In latent labor prior to admission.  Given 1 Cytotec, progressed to active labor and delivered vaginally in less than 12 hours. Pt seen and examined in office ~48 hours prior to admission.  Prenatal care uncomplicated.  Stillbirth prior to 20 weeks with last pregnancy due to fetal anomaly.   Maternal Medical History:  Reason for admission: Induction of labor due to postdates but also in latent labor.  Contractions: Onset was more than 2 days ago.   Frequency: regular.   Perceived severity is strong.    Fetal activity: Perceived fetal activity is normal.    Prenatal complications: no prenatal complications Prenatal Complications - Diabetes: none.    OB History as of 12/06/12   Grav Para Term Preterm Abortions TAB SAB Ect Mult Living   2 1 1  0 1 0 1 0 0 1     Past Medical History  Diagnosis Date  . Headache   . Depression   . Abnormal Pap smear   . Thyroid nodule   . Hx of varicella    Past Surgical History  Procedure Laterality Date  . Wisdom tooth extraction    . Leep    . Gynecologic cryosurgery     Family History: family history includes Hypertension in her father and mother.  There is no history of Other. Social History:  reports that she has never smoked. She has never used smokeless tobacco. She reports that she does not drink alcohol or use illicit drugs.   Prenatal Transfer Tool  Maternal Diabetes: No Genetic Screening: Normal Maternal Ultrasounds/Referrals: Normal Fetal Ultrasounds or other Referrals:  None Maternal Substance Abuse:  No Significant Maternal Medications:  None Significant Maternal Lab Results:  Lab values include: Group B Strep positive Other Comments:  None  Review of Systems  Gastrointestinal: Positive for abdominal pain.  Genitourinary:       No LOF or VB    Dilation: 10 Effacement (%): 100 Station: Crowning Exam by:: [employer] Blood pressure 113/73, pulse  93, temperature 98.4 F (36.9 C), temperature source Oral, resp. rate 18, height 5\' 9"  (1.753 m), weight 92.897 kg (204 lb 12.8 oz), last menstrual period 02/06/2012, SpO2 100.00%, unknown if currently breastfeeding. Maternal Exam:  Abdomen: Patient reports no abdominal tenderness. Estimated fetal weight is 7 lbs.   Fetal presentation: vertex  Introitus: Normal vulva. Normal vagina.  Ferning test: not done.  Nitrazine test: not done.  Pelvis: adequate for delivery.   Cervix: Cervix evaluated by digital exam.     Physical Exam  Constitutional: She is oriented to person, place, and time. She appears well-developed and well-nourished. No distress.  HENT:  Head: Normocephalic and atraumatic.  Eyes: EOM are normal.  Neck: Normal range of motion.  Respiratory: No respiratory distress.  Genitourinary: Vagina normal and uterus normal.  Musculoskeletal: She exhibits edema. She exhibits no tenderness.  Neurological: She is alert and oriented to person, place, and time.  Skin: Skin is warm and dry. She is not diaphoretic.  Psychiatric: She has a normal mood and affect.   CVX 1/70/-2 in office Prenatal labs: ABO, Rh: A/Positive/-- (07/15 0000) Antibody: Negative (07/15 0000) Rubella: Immune (07/15 0000) RPR: NON REACTIVE (02/25 1830)  HBsAg: Negative (07/15 0000)  HIV: Non-reactive (07/15 0000)  GBS: Positive (07/15 0000)   Assessment/Plan: Postdates pregnancy for IOL. SVD after induction/augmentation of labor with subsequent SVD.  Pt doing well.  Geryl Rankins 12/15/2012, 11:09 PM

## 2013-01-08 ENCOUNTER — Ambulatory Visit (HOSPITAL_COMMUNITY)
Admission: RE | Admit: 2013-01-08 | Discharge: 2013-01-08 | Disposition: A | Discharge status: Home / Self Care | Payer: BC Managed Care – PPO | Source: Ambulatory Visit | Attending: Obstetrics and Gynecology | Admitting: Obstetrics and Gynecology

## 2013-01-08 NOTE — Lactation Note (Signed)
Adult Lactation Consultation Outpatient Visit Note  Patient Name: Jennifer Mayer    BABY: Kipp Laurence Date of Birth: [birth year]03/1986                    DOB: [birth year] Gestational Age at Delivery: term      BIRTH WEIGHT: 7-12.7 Type of Delivery:                                 WEIGHT TODAY: 12-12.6  Breastfeeding History: Frequency of Breastfeeding: NONE IN 2-3 WEEKS Length of Feeding:  Voids:QS  Stools: QS  Supplementing / Method:EBM/BOTTLE 4 oz every 3-4 hours Pumping:  Type of Pump:PUMP IN STYLE   Frequency:EVERY 4 HOURS  Volume: 10 OZ LEFT BREAST/6-7 OZ RIGHT BREAST   Comments:    Consultation Evaluation:Mom and 61 week old baby here to assist with relatching baby to breast.  Mom statrted pumping and bottlefeeding 2-3 weeks ago due to sore nipples and frequent feedings.  Mom now would like the ease of baby breastfeeding especially night time feedings.  She has attempted latching baby once a day but has only been successful once for 10 minutes.  Baby was just fed 4 oz prior to appointment.  Attempted at breast several times with and without nipple shield and baby frantic.  Baby acting very tired and only briefly latched with 24 mm nipple shield.  Plan reviewed with mom to assist in techniques to get baby back to breast.  She will work with the baby more at home and call in 1 week if no progress.  Recommended she bring baby in at least 2 hours past last feed if another appointment needed.  Initial Feeding Assessment:BABY DID NOT FEED Pre-feed Weight: Post-feed Weight: Amount Transferred: Comments:  Additional Feeding Assessment: Pre-feed Weight: Post-feed Weight: Amount Transferred: Comments:  Additional Feeding Assessment: Pre-feed Weight: Post-feed Weight: Amount Transferred: Comments:  Total Breast milk Transferred this Visit: N/A Total Supplement Given: N/A  Additional Interventions:   Follow-Up WILL CALL PRN      Hansel Feinstein 01/08/2013, 2:07 PM

## 2014-01-29 ENCOUNTER — Ambulatory Visit: Payer: BC Managed Care – PPO | Admitting: Physical Therapy

## 2014-07-28 ENCOUNTER — Encounter (HOSPITAL_COMMUNITY): Payer: Self-pay

## 2014-07-29 ENCOUNTER — Encounter (HOSPITAL_COMMUNITY): Payer: Self-pay | Admitting: *Deleted

## 2014-07-29 ENCOUNTER — Emergency Department (HOSPITAL_COMMUNITY): Payer: BC Managed Care – PPO

## 2014-07-29 ENCOUNTER — Emergency Department (HOSPITAL_COMMUNITY)
Admission: EM | Admit: 2014-07-29 | Discharge: 2014-07-29 | Disposition: A | Discharge status: Home / Self Care | Payer: BC Managed Care – PPO | Attending: Emergency Medicine | Admitting: Emergency Medicine

## 2014-07-29 ENCOUNTER — Encounter (HOSPITAL_COMMUNITY): Payer: Self-pay | Admitting: Emergency Medicine

## 2014-07-29 ENCOUNTER — Emergency Department (HOSPITAL_COMMUNITY)
Admission: EM | Admit: 2014-07-29 | Discharge: 2014-07-30 | Disposition: A | Discharge status: Home / Self Care | Payer: BC Managed Care – PPO | Source: Home / Self Care | Attending: Emergency Medicine | Admitting: Emergency Medicine

## 2014-07-29 DIAGNOSIS — R0789 Other chest pain: Secondary | ICD-10-CM | POA: Insufficient documentation

## 2014-07-29 DIAGNOSIS — R002 Palpitations: Secondary | ICD-10-CM

## 2014-07-29 DIAGNOSIS — F329 Major depressive disorder, single episode, unspecified: Secondary | ICD-10-CM | POA: Insufficient documentation

## 2014-07-29 DIAGNOSIS — I4589 Other specified conduction disorders: Secondary | ICD-10-CM | POA: Insufficient documentation

## 2014-07-29 DIAGNOSIS — R000 Tachycardia, unspecified: Secondary | ICD-10-CM

## 2014-07-29 DIAGNOSIS — Z8619 Personal history of other infectious and parasitic diseases: Secondary | ICD-10-CM | POA: Insufficient documentation

## 2014-07-29 DIAGNOSIS — R0602 Shortness of breath: Secondary | ICD-10-CM

## 2014-07-29 DIAGNOSIS — R7889 Finding of other specified substances, not normally found in blood: Secondary | ICD-10-CM | POA: Diagnosis not present

## 2014-07-29 DIAGNOSIS — Z8639 Personal history of other endocrine, nutritional and metabolic disease: Secondary | ICD-10-CM | POA: Insufficient documentation

## 2014-07-29 DIAGNOSIS — Z79899 Other long term (current) drug therapy: Secondary | ICD-10-CM

## 2014-07-29 DIAGNOSIS — Z3202 Encounter for pregnancy test, result negative: Secondary | ICD-10-CM | POA: Insufficient documentation

## 2014-07-29 DIAGNOSIS — R110 Nausea: Secondary | ICD-10-CM | POA: Diagnosis not present

## 2014-07-29 DIAGNOSIS — R079 Chest pain, unspecified: Secondary | ICD-10-CM | POA: Diagnosis present

## 2014-07-29 DIAGNOSIS — F419 Anxiety disorder, unspecified: Secondary | ICD-10-CM

## 2014-07-29 DIAGNOSIS — Z8659 Personal history of other mental and behavioral disorders: Secondary | ICD-10-CM | POA: Diagnosis not present

## 2014-07-29 DIAGNOSIS — R51 Headache: Secondary | ICD-10-CM

## 2014-07-29 DIAGNOSIS — R9431 Abnormal electrocardiogram [ECG] [EKG]: Secondary | ICD-10-CM

## 2014-07-29 DIAGNOSIS — R7989 Other specified abnormal findings of blood chemistry: Secondary | ICD-10-CM

## 2014-07-29 DIAGNOSIS — M940 Chondrocostal junction syndrome [Tietze]: Secondary | ICD-10-CM | POA: Insufficient documentation

## 2014-07-29 LAB — COMPREHENSIVE METABOLIC PANEL
ALT: 20 U/L (ref 0–35)
ANION GAP: 15 (ref 5–15)
AST: 48 U/L — AB (ref 0–37)
Albumin: 4.7 g/dL (ref 3.5–5.2)
Alkaline Phosphatase: 30 U/L — ABNORMAL LOW (ref 39–117)
BUN: 11 mg/dL (ref 6–23)
CALCIUM: 9.5 mg/dL (ref 8.4–10.5)
CO2: 21 meq/L (ref 19–32)
CREATININE: 0.91 mg/dL (ref 0.50–1.10)
Chloride: 102 mEq/L (ref 96–112)
GFR, EST NON AFRICAN AMERICAN: 84 mL/min — AB (ref >90)
GLUCOSE: 101 mg/dL — AB (ref 70–99)
Potassium: 6.6 mEq/L (ref 3.7–5.3)
Sodium: 138 mEq/L (ref 137–147)
Total Bilirubin: 0.8 mg/dL (ref 0.3–1.2)
Total Protein: 8.4 g/dL — ABNORMAL HIGH (ref 6.0–8.3)

## 2014-07-29 LAB — CBC
HEMATOCRIT: 39 % (ref 36.0–46.0)
Hemoglobin: 12.5 g/dL (ref 12.0–15.0)
MCH: 28.6 pg (ref 26.0–34.0)
MCHC: 32.1 g/dL (ref 30.0–36.0)
MCV: 89.2 fL (ref 78.0–100.0)
PLATELETS: 200 10*3/uL (ref 150–400)
RBC: 4.37 MIL/uL (ref 3.87–5.11)
RDW: 13 % (ref 11.5–15.5)
WBC: 5.9 10*3/uL (ref 4.0–10.5)

## 2014-07-29 LAB — URINE MICROSCOPIC-ADD ON

## 2014-07-29 LAB — URINALYSIS, ROUTINE W REFLEX MICROSCOPIC
BILIRUBIN URINE: NEGATIVE
Glucose, UA: NEGATIVE mg/dL
HGB URINE DIPSTICK: NEGATIVE
KETONES UR: 15 mg/dL — AB
NITRITE: NEGATIVE
PROTEIN: NEGATIVE mg/dL
Specific Gravity, Urine: 1.04 — ABNORMAL HIGH (ref 1.005–1.030)
UROBILINOGEN UA: 0.2 mg/dL (ref 0.0–1.0)
pH: 6 (ref 5.0–8.0)

## 2014-07-29 LAB — D-DIMER, QUANTITATIVE: D-Dimer, Quant: 1.46 ug/mL-FEU — ABNORMAL HIGH (ref 0.00–0.48)

## 2014-07-29 LAB — POC URINE PREG, ED: Preg Test, Ur: NEGATIVE

## 2014-07-29 LAB — POTASSIUM: Potassium: 3.3 mEq/L — ABNORMAL LOW (ref 3.7–5.3)

## 2014-07-29 LAB — I-STAT TROPONIN, ED
TROPONIN I, POC: 0.01 ng/mL (ref 0.00–0.08)
TROPONIN I, POC: 0.01 ng/mL (ref 0.00–0.08)

## 2014-07-29 MED ORDER — HYDROMORPHONE HCL 1 MG/ML IJ SOLN
0.5000 mg | Freq: Once | INTRAMUSCULAR | Status: AC
  Administered 2014-07-29: 0.5 mg via INTRAVENOUS
  Filled 2014-07-29: qty 1

## 2014-07-29 MED ORDER — KETOROLAC TROMETHAMINE 30 MG/ML IJ SOLN
30.0000 mg | Freq: Once | INTRAMUSCULAR | Status: AC
  Administered 2014-07-29: 30 mg via INTRAVENOUS
  Filled 2014-07-29: qty 1

## 2014-07-29 MED ORDER — SODIUM CHLORIDE 0.9 % IV BOLUS (SEPSIS)
1000.0000 mL | Freq: Once | INTRAVENOUS | Status: AC
  Administered 2014-07-29: 1000 mL via INTRAVENOUS

## 2014-07-29 MED ORDER — IOHEXOL 350 MG/ML SOLN
100.0000 mL | Freq: Once | INTRAVENOUS | Status: AC | PRN
  Administered 2014-07-29: 100 mL via INTRAVENOUS

## 2014-07-29 MED ORDER — HYDROCODONE-ACETAMINOPHEN 5-325 MG PO TABS: 1.0000 | ORAL_TABLET | Freq: Four times a day (QID) | ORAL | Status: DC | PRN

## 2014-07-29 MED ORDER — ONDANSETRON HCL 4 MG/2ML IJ SOLN
4.0000 mg | Freq: Once | INTRAMUSCULAR | Status: AC
  Administered 2014-07-29: 4 mg via INTRAVENOUS
  Filled 2014-07-29: qty 2

## 2014-07-29 NOTE — Discharge Instructions (Signed)
Please stop your Celexa as it is affecting your heart rhythm. Please follow up with a Cardiologist.  Costochondritis Costochondritis, sometimes called Tietze syndrome, is a swelling and irritation (inflammation) of the tissue (cartilage) that connects your ribs with your breastbone (sternum). It causes pain in the chest and rib area. Costochondritis usually goes away on its own over time. It can take up to 6 weeks or longer to get better, especially if you are unable to limit your activities. CAUSES  Some cases of costochondritis have no known cause. Possible causes include:  Injury (trauma).  Exercise or activity such as lifting.  Severe coughing. SIGNS AND SYMPTOMS  Pain and tenderness in the chest and rib area.  Pain that gets worse when coughing or taking deep breaths.  Pain that gets worse with specific movements. DIAGNOSIS  Your health care provider will do a physical exam and ask about your symptoms. Chest X-rays or other tests may be done to rule out other problems. TREATMENT  Costochondritis usually goes away on its own over time. Your health care provider may prescribe medicine to help relieve pain. HOME CARE INSTRUCTIONS   Avoid exhausting physical activity. Try not to strain your ribs during normal activity. This would include any activities using chest, abdominal, and side muscles, especially if heavy weights are used.  Apply ice to the affected area for the first 2 days after the pain begins.  Put ice in a plastic bag.  Place a towel between your skin and the bag.  Leave the ice on for 20 minutes, 2-3 times a day.  Only take over-the-counter or prescription medicines as directed by your health care provider. SEEK MEDICAL CARE IF:  You have redness or swelling at the rib joints. These are signs of infection.  Your pain does not go away despite rest or medicine. SEEK IMMEDIATE MEDICAL CARE IF:   Your pain increases or you are very uncomfortable.  You have  shortness of breath or difficulty breathing.  You cough up blood.  You have worse chest pains, sweating, or vomiting.  You have a fever or persistent symptoms for more than 2-3 days.  You have a fever and your symptoms suddenly get worse. MAKE SURE YOU:   Understand these instructions.  Will watch your condition.  Will get help right away if you are not doing well or get worse. Document Released: 06/22/2005 Document Revised: 07/03/2013 Document Reviewed: 04/16/2013 [facility] Patient Information 2015 [place], [place]. This information is not intended to replace advice given to you by your health care provider. Make sure you discuss any questions you have with your health care provider.

## 2014-07-29 NOTE — ED Notes (Signed)
Dr. Gwendolyn GrantWalden informed of Potassium results

## 2014-07-29 NOTE — ED Notes (Signed)
Pt placed in room and placed on monitor. RN notified.

## 2014-07-29 NOTE — ED Notes (Signed)
Pt in c/o palpitations and chest pain, states she was seen for this earlier today and discharged, states she is unable to rest at home due to the palpitations, also shortness of breath, denies other symptoms

## 2014-07-29 NOTE — ED Notes (Signed)
Dr. Oni at bedside. 

## 2014-07-29 NOTE — ED Provider Notes (Signed)
CSN: [id number]     Arrival date & time 07/29/14  1333 History   First MD Initiated Contact with Patient 07/29/14 1406     Chief Complaint  Patient presents with  . Chest Pain     (Consider location/radiation/quality/duration/timing/severity/associated sxs/prior Treatment) Patient is a 28 y.o. female presenting with chest pain. The history is provided by the patient.  Chest Pain Pain location:  Substernal area Pain quality: crushing   Pain radiates to:  Does not radiate Pain radiates to the back: no   Pain severity:  Severe Onset quality:  Gradual Duration:  1 day Timing:  Constant Progression:  Unchanged Chronicity:  Recurrent Context: at rest   Relieved by:  Nothing Worsened by:  Deep breathing, coughing and movement Associated symptoms: nausea and shortness of breath (mild)   Associated symptoms: no abdominal pain, no back pain, no cough, no fever and not vomiting     Past Medical History  Diagnosis Date  . Headache(784.0)   . Depression   . Abnormal Pap smear   . Thyroid nodule   . Hx of varicella    Past Surgical History  Procedure Laterality Date  . Wisdom tooth extraction    . Leep    . Gynecologic cryosurgery     Family History  Problem Relation Age of Onset  . Other Neg Hx   . Hypertension Mother   . Hypertension Father    History  Substance Use Topics  . Smoking status: Never Smoker   . Smokeless tobacco: Never Used  . Alcohol Use: No   OB History    Gravida Para Term Preterm AB TAB SAB Ectopic Multiple Living   2 1 1  0 1 0 1 0 0 1     Review of Systems  Constitutional: Negative for fever.  Respiratory: Positive for shortness of breath (mild). Negative for cough.   Cardiovascular: Positive for chest pain.  Gastrointestinal: Positive for nausea. Negative for vomiting and abdominal pain.  Musculoskeletal: Negative for back pain.  All other systems reviewed and are negative.     Allergies  Flagyl and Morphine and related  Home  Medications   Prior to Admission medications   Medication Sig Start Date End Date Taking? Authorizing Provider  acetaminophen (TYLENOL) 500 MG tablet Take 500 mg by mouth every 6 (six) hours as needed. pain    Historical Provider, MD  diphenhydrAMINE (BENADRYL) 25 MG tablet Take 25 mg by mouth at bedtime as needed for sleep.    Historical Provider, MD  Fe Fum-FePoly-Vit C-Vit B3 (INTEGRA PO) Take 1 tablet by mouth daily.     Historical Provider, MD  ibuprofen (ADVIL,MOTRIN) 600 MG tablet Take 1 tablet (600 mg total) by mouth every 6 (six) hours as needed for pain. 11/23/12   Dorien Chihuahuaara J. Richardson Doppole, MD  Prenatal Vit-Fe Fumarate-FA (PRENATAL MULTIVITAMIN) TABS Take 1 tablet by mouth daily.    Historical Provider, MD   BP 125/85 mmHg  Pulse 111  Temp(Src) 97.4 F (36.3 C)  Resp 16  Wt 174 lb (78.926 kg)  SpO2 100% Physical Exam  Constitutional: She is oriented to person, place, and time. She appears well-developed and well-nourished. No distress.  HENT:  Head: Normocephalic and atraumatic.  Mouth/Throat: Oropharynx is clear and moist.  Eyes: EOM are normal. Pupils are equal, round, and reactive to light.  Neck: Normal range of motion. Neck supple.  Cardiovascular: Regular rhythm.  Tachycardia present.  Exam reveals no friction rub.   No murmur heard. Pulmonary/Chest: Effort normal and  25 [phone] 6<[id number]>34-Yates Deca p<MEASUREME40 [phone]-[phone]< MEASUREMENT>Ke([phone] BlYates Deca p78<BA EXT64 437- 339-[phone] vi46.9<MEASU([phone]>Ed BlY es Deca<29 903-375-[place]5970TTA44.0ivi46.9 [place]asEd Blalock([phone]) [phone] weKoreaeks31) V46.9via94607 583 1521 XTTAG>8-73<BADTEX28 650-409-[place]038871444.0ivi46.9 [place]Ed Blalock257 [phone] 43 (610)320-7[place]orea500e<BA44.0ivi46.9 [place]as339 187 5546d Blalock9Yates Deca pXTTAG>59-8421 020-[place]orea8 [phone])[phone] SUREMENT>Kentu([phone] BlalYates Deca<BAD EXTTAG>p) [phone]<BADTEXTT >38 857-85 9-[phone] 0>snViYat De7135485<BA86 470-886-[place]orea619317944.0ivi46.9 [place]asEd Blalock76779 -625-8843<MEASUREMEN60 3608Korea551204X44.0ivi46.9 [place]asEd B920-553-9084alock48278Yates Deca pXTTAG>-619 938-160-K [phone] [place]<MEd Blalock>Kent[place]asEd Blaloc936-573-8773kXTTAG>>3as  Central crushing-like chest pain with some shortness of breath and nausea. No fevers. History of costochondritis on and off her past 8 months. EKG done at her doctor's office show a prolonged QT. She is on Celexa. No relief with ibuprofen today. We'll start with Toradol and Dilaudid, chest x-ray, labs including d-dimer as she is tachycardic. No palpable chest tenderness Initial K hemolyzed, repeat normal.  Dimer elevated, CT PE study negative.  Patient's urine contaminated, 0-2 WBCs, many squamous cells.  Instructed to f/u with Cards for her prolonged QT interval and to stop her celexa until she sees Cards. I have reviewed all labs and imaging and considered them in my medical decision making.   Elwin Mocha, MD 07/29/14 [phone]

## 2014-07-29 NOTE — ED Provider Notes (Signed)
CSN: [id number]     Arrival date & time 07/29/14  2224 History   First MD Initiated Contact with Patient 07/29/14 2256     Chief Complaint  Patient presents with  . Palpitations     (Consider location/radiation/quality/duration/timing/severity/associated sxs/prior Treatment) HPI  Jennifer Mayer is a 28 y.o. female with past medical history of depression, thyroid nodule coming in with chest pain and palpitations. Patient states she was seen earlier today had a workup including CTA which was negative for pulmonary embolism. She was sent home with Norco which helped relieve her pain, however she began having palpitations again which has not allowed her to sleep. She has had chest pain since March and was diagnosed with costochondritis. She states her pain is okay but the palpitations over the last 24 hours are new and concerning to her. It is associated with shortness of breath. There is no emesis or diaphoresis. Her chest pain is on the right side without any radiation and it is sharp. Patient was told to stop taking her Celexa because she had a prolonged QT which she believes is new. She states she gets very anxious with the tachycardia, she denies diaphoresis, change in weight, change in bowel movements. She does have a 4020-month-old at home and states she has not been eating or drinking as well as normal. Her symptoms suddenly come off and certainly go on. Nothing makes it better or worse.  10 Systems reviewed and are negative for acute change except as noted in the HPI.     Past Medical History  Diagnosis Date  . Headache(784.0)   . Depression   . Abnormal Pap smear   . Thyroid nodule   . Hx of varicella    Past Surgical History  Procedure Laterality Date  . Wisdom tooth extraction    . Leep    . Gynecologic cryosurgery     Family History  Problem Relation Age of Onset  . Other Neg Hx   . Hypertension Mother   . Hypertension Father    History  Substance Use Topics  . Smoking  status: Never Smoker   . Smokeless tobacco: Never Used  . Alcohol Use: No   OB History    Gravida Para Term Preterm AB TAB SAB Ectopic Multiple Living   2 1 1  0 1 0 1 0 0 1     Review of Systems    Allergies  Flagyl and Morphine and related  Home Medications   Prior to Admission medications   Medication Sig Start Date End Date Taking? Authorizing Provider  acetaminophen (TYLENOL) 500 MG tablet Take 500 mg by mouth every 6 (six) hours as needed (pain).     Historical Provider, MD  citalopram (CELEXA) 20 MG tablet Take 20 mg by mouth daily.    Historical Provider, MD  diazepam (VALIUM) 5 MG tablet Take 5 mg by mouth every 12 (twelve) hours as needed for anxiety.    Historical Provider, MD  diphenhydrAMINE (BENADRYL) 25 MG tablet Take 25 mg by mouth at bedtime as needed for sleep.    Historical Provider, MD  diphenhydramine-acetaminophen (TYLENOL PM) 25-500 MG TABS Take 1 tablet by mouth at bedtime as needed (sleep).    Historical Provider, MD  Fe Fum-FePoly-Vit C-Vit B3 (INTEGRA PO) Take 1 tablet by mouth daily.     Historical Provider, MD  HYDROcodone-acetaminophen (NORCO/VICODIN) 5-325 MG per tablet Take 1 tablet by mouth every 6 (six) hours as needed for moderate pain. 07/29/14   Carlena SaxBlair  Gwendolyn GrantWalden, MD  ibuprofen (ADVIL,MOTRIN) 600 MG tablet Take 1 tablet (600 mg total) by mouth every 6 (six) hours as needed for pain. 11/23/12   Dorien Chihuahuaara J. Richardson Doppole, MD  Prenatal Vit-Fe Fumarate-FA (PRENATAL MULTIVITAMIN) TABS Take 1 tablet by mouth daily.    Historical Provider, MD  traMADol (ULTRAM) 50 MG tablet Take 50 mg by mouth every 6 (six) hours as needed (pain).    Historical Provider, MD   BP 129/82 mmHg  Pulse 121  Temp(Src) 98.2 F (36.8 C) (Oral)  Resp 18  Ht 5\' 9"  (1.753 m)  Wt 174 lb (78.926 kg)  BMI 25.68 kg/m2  SpO2 100% Physical Exam  Constitutional: She is oriented to person, place, and time. She appears well-developed and well-nourished. No distress.  HENT:  Head: Normocephalic and  atraumatic.  Eyes: Conjunctivae and EOM are normal. Pupils are equal, round, and reactive to light. No scleral icterus.  Neck: Normal range of motion. Neck supple. No JVD present. No tracheal deviation present. No thyromegaly present.  Cardiovascular: Regular rhythm.  Exam reveals no gallop and no friction rub.   Murmur heard. Tachycardia  Pulmonary/Chest: Effort normal and breath sounds normal. No respiratory distress. She has no wheezes. She exhibits no tenderness.  Abdominal: Soft. Bowel sounds are normal. She exhibits no distension and no mass. There is no tenderness. There is no rebound and no guarding.  Musculoskeletal: Normal range of motion. She exhibits no edema or tenderness.  Lymphadenopathy:    She has no cervical adenopathy.  Neurological: She is alert and oriented to person, place, and time. No cranial nerve deficit. She exhibits normal muscle tone.  Skin: Skin is warm and dry. No rash noted. She is not diaphoretic. No erythema. No pallor.  Nursing note and vitals reviewed.   ED Course  Procedures (including critical care time) Labs Review Labs Reviewed  CBC WITH DIFFERENTIAL - Abnormal; Notable for the following:    RBC 3.59 (*)    Hemoglobin 10.8 (*)    HCT 31.8 (*)    All other components within normal limits  BASIC METABOLIC PANEL - Abnormal; Notable for the following:    Potassium 3.4 (*)    Glucose, Bld 118 (*)    GFR calc non Af Amer 86 (*)    All other components within normal limits  TSH - Abnormal; Notable for the following:    TSH 7.760 (*)    All other components within normal limits  MAGNESIUM  T4, FREE  I-STAT TROPOININ, ED    Imaging Review Dg Chest 2 View  07/29/2014   CLINICAL DATA:  Intermittent generalized chest pain for its 8 months, now more severe ; new onset leg weakness  EXAM: CHEST  2 VIEW  COMPARISON:  PA and lateral chest x-ray of December 30, 2013.  FINDINGS: The lungs are adequately inflated. There is no focal infiltrate. There is new  faint curvilinear lucency in the right upper hemithorax consistent with a thin walled cavity. It measures a 2.9 cm AP x 2.6 cm transversely. The heart and pulmonary vascularity are normal. The mediastinum is normal in width. There is no pleural effusion. The bony thorax is unremarkable.  IMPRESSION: A subtle cavitary lesion be present in the right upper lobe and may reflect infectious or inflammatory processes. Otherwise the lungs are clear. Chest CT scanning is recommended.   Electronically Signed   By: David  Swazil[place]Jordan   On: 07/29/2014 15:09   Ct Angio Chest Pe W/cm &/or Wo Cm  07/29/2014  CLINICAL DATA:  Chest pain and shortness of breath. Abnormal chest x-ray. Elevated D-dimer.  EXAM: CT ANGIOGRAPHY CHEST WITH CONTRAST  TECHNIQUE: Multidetector CT imaging of the chest was performed using the standard protocol during bolus administration of intravenous contrast. Multiplanar CT image reconstructions and MIPs were obtained to evaluate the vascular anatomy.  CONTRAST:  OMNIPAQUE IOHEXOL 350 MG/ML SOLN  COMPARISON:  Chest x-ray of the same day.  FINDINGS: Pulmonary arterial opacification is satisfactory. There is no focal filling defect to suggest pulmonary embolus. The heart size is normal. No significant mediastinal or axillary adenopathy is present. There is no significant pleural or pericardial effusion. The thoracic inlet is normal. At the upper abdomen is unremarkable.  No focal nodule, mass, or airspace disease is present in the lungs.  Bone windows demonstrate no focal lytic or blastic lesions.  Review of the MIP images confirms the above findings.  IMPRESSION: 1. No cavitary lesion is evident. This likely represented a confluence of shadows. At the lungs are clear. 2. No evidence for pulmonary embolus. 3. Negative CTA chest.   Electronically Signed   By: Gennette Pac M.D.   On: 07/29/2014 16:46     EKG Interpretation   Date/Time:  Wednesday July 30 2014 00:53:07 EST Ventricular Rate:   82 PR Interval:  159 QRS Duration: 87 QT Interval:  353 QTC Calculation: 412 R Axis:   29 Text Interpretation:  Sinus rhythm Borderline repolarization abnormality  Borderline ST elevation, lateral leads normal QT Confirmed by Erroll Luna [phone]) on 07/30/2014 1:36:54 AM      MDM   Final diagnoses:  None    Patient presents emergency department out of concern for chest pain and palpitations. She had a thorough workup including troponin and CTA for PE which was all negative. Repeat troponin here is negative as well. This may be related to her prolonged QT which I have to assume is new. Will obtain electrolytes including magnesium, give 1 L of fluid, and obtain thyroid studies.  Potassium was replaced. TSH is elevated, free T4 will not be available until the morning. Patient is advised to follow the primary care physician or cardiology within 3 days for repeat thyroid testing as well as evaluation for prolonged QT. She is advised to discontinue her Celexa as well. EKG reveals a QTC that is now 400. Also feels symptomatically better and denies palpitations currently. While was in the room her heart rate came down to the 90s after liter fluid. She is advised to eat and drink normal amountsdespite the stress of having a 44-month-old baby.. Her vital signs are within her normal limits and she is safe for discharge.   Tomasita Crumble, MD 07/30/14 [phone]

## 2014-07-29 NOTE — ED Notes (Signed)
Cp  On and off for months since march got bad  Last night  Feels nausea and sob feel like her legs are weak

## 2014-07-30 LAB — CBC WITH DIFFERENTIAL/PLATELET
BASOS PCT: 0 % (ref 0–1)
Basophils Absolute: 0 10*3/uL (ref 0.0–0.1)
EOS ABS: 0 10*3/uL (ref 0.0–0.7)
EOS PCT: 0 % (ref 0–5)
HCT: 31.8 % — ABNORMAL LOW (ref 36.0–46.0)
Hemoglobin: 10.8 g/dL — ABNORMAL LOW (ref 12.0–15.0)
Lymphocytes Relative: 34 % (ref 12–46)
Lymphs Abs: 2.6 10*3/uL (ref 0.7–4.0)
MCH: 30.1 pg (ref 26.0–34.0)
MCHC: 34 g/dL (ref 30.0–36.0)
MCV: 88.6 fL (ref 78.0–100.0)
Monocytes Absolute: 0.8 10*3/uL (ref 0.1–1.0)
Monocytes Relative: 10 % (ref 3–12)
NEUTROS PCT: 56 % (ref 43–77)
Neutro Abs: 4.4 10*3/uL (ref 1.7–7.7)
PLATELETS: 261 10*3/uL (ref 150–400)
RBC: 3.59 MIL/uL — ABNORMAL LOW (ref 3.87–5.11)
RDW: 12.9 % (ref 11.5–15.5)
WBC: 7.8 10*3/uL (ref 4.0–10.5)

## 2014-07-30 LAB — T4, FREE: Free T4: 1.4 ng/dL (ref 0.80–1.80)

## 2014-07-30 LAB — TSH: TSH: 7.76 u[IU]/mL — ABNORMAL HIGH (ref 0.350–4.500)

## 2014-07-30 LAB — BASIC METABOLIC PANEL
Anion gap: 15 (ref 5–15)
BUN: 11 mg/dL (ref 6–23)
CO2: 22 mEq/L (ref 19–32)
Calcium: 9.5 mg/dL (ref 8.4–10.5)
Chloride: 106 mEq/L (ref 96–112)
Creatinine, Ser: 0.89 mg/dL (ref 0.50–1.10)
GFR, EST NON AFRICAN AMERICAN: 86 mL/min — AB (ref >90)
Glucose, Bld: 118 mg/dL — ABNORMAL HIGH (ref 70–99)
POTASSIUM: 3.4 meq/L — AB (ref 3.7–5.3)
SODIUM: 143 meq/L (ref 137–147)

## 2014-07-30 LAB — MAGNESIUM: MAGNESIUM: 2.1 mg/dL (ref 1.5–2.5)

## 2014-07-30 MED ORDER — POTASSIUM CHLORIDE CRYS ER 20 MEQ PO TBCR
40.0000 meq | EXTENDED_RELEASE_TABLET | Freq: Once | ORAL | Status: AC
  Administered 2014-07-30: 40 meq via ORAL
  Filled 2014-07-30: qty 2

## 2014-07-30 NOTE — Discharge Instructions (Signed)
Palpitations Ms. Jennifer Mayer, you were seen today for a rapid heart rate. Your given a liter of fluid in your heart rate came back down to normal. Your thyroid studies were abnormal and he needs to follow-up with a primary care physician within 3 days for repeat testing. This May be causing your symptoms. Your QT interval came back down to 412 on your EKG. Follow-up with cardiology for continued evaluation. If any of her symptoms worsen come back to the emergency department. Thank you. A palpitation is the feeling that your heartbeat is irregular. It may feel like your heart is fluttering or skipping a beat. It may also feel like your heart is beating faster than normal. This is usually not a serious problem. In some cases, you may need more medical tests. HOME CARE  Avoid:  Caffeine in coffee, tea, soft drinks, diet pills, and energy drinks.  Chocolate.  Alcohol.  Stop smoking if you smoke.  Reduce your stress and anxiety. Try:  A method that measures bodily functions so you can learn to control them (biofeedback).  Yoga.  Meditation.  Physical activity such as swimming, jogging, or walking.  Get plenty of rest and sleep. GET HELP IF:  Your fast or irregular heartbeat continues after 24 hours.  Your palpitations occur more often. GET HELP RIGHT AWAY IF:   You have chest pain.  You feel short of breath.  You have a very bad headache.  You feel dizzy or pass out (faint). MAKE SURE YOU:   Understand these instructions.  Will watch your condition.  Will get help right away if you are not doing well or get worse. Document Released: 06/21/2008 Document Revised: 01/27/2014 Document Reviewed: 11/11/2011 [facility] Patient Information 2015 [employer] [place]. This information is not intended to replace advice given to you by your health care provider. Make sure you discuss any questions you have with your health care provider.

## 2014-08-11 ENCOUNTER — Ambulatory Visit (INDEPENDENT_AMBULATORY_CARE_PROVIDER_SITE_OTHER): Payer: BC Managed Care – PPO | Admitting: Cardiovascular Disease

## 2014-08-11 ENCOUNTER — Encounter: Payer: Self-pay | Admitting: Cardiovascular Disease

## 2014-08-11 VITALS — BP 114/70 | HR 89 | Ht 69.0 in | Wt 172.4 lb

## 2014-08-11 DIAGNOSIS — I309 Acute pericarditis, unspecified: Secondary | ICD-10-CM

## 2014-08-11 MED ORDER — COLCHICINE 0.6 MG PO TABS: 0.6000 mg | ORAL_TABLET | Freq: Two times a day (BID) | ORAL | Status: DC

## 2014-08-11 NOTE — Patient Instructions (Addendum)
Your physician has requested that you have an echocardiogram. Echocardiography is a painless test that uses sound waves to create images of your heart. It provides your doctor with information about the size and shape of your heart and how well your heart's chambers and valves are working. This procedure takes approximately one hour. There are no restrictions for this procedure.  Your physician has recommended you make the following change in your medication:  START Colchicine 0.6 mg twice daily for one week and then daily  Your physician recommends that you schedule a follow-up appointment in: 1 month with Dr. Elease HashimotoNahser.

## 2014-08-11 NOTE — Assessment & Plan Note (Addendum)
Jennifer CritchleyJanese presents with symptoms that are consistent with acute pericarditis. She's had these pains off and on since March. She has a definite pleuritic chest pain. The pain worsens when she last down and is better when she sits forward. She's had EKGs which showed nonspecific changes. She's not had any classic EKGs that showed pericarditis.  We'll get an echocardiogram.  He's been treated with Motrin on occasion and also has received a steroid taper. She's had recurrent chest pain following these. 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  Will start her on colchicine 0.6 mg twice a day. We'll have her continue the Motrin 600 mg 3 times a day for the first week or so and then she can taper down to 200 mg 3 times a day. I've also given her the okay to decrease the colchicine to 0.6 mg a day after next week. Her recent pregnancy tests was negative.  We have stressed the importance of not getting pregnant.   I'll see her again in one month. She is to call me if she has any worsening pains.

## 2014-08-11 NOTE — Progress Notes (Signed)
Jennifer Mayer Date of Birth  March 25, 1986       [facility]    [place] [facility] [address], [address]  [address], [address] [place], [place]  [address]   [place], [place]  [id number] [phone]     [phone]   Fax  [phone]     Fax [phone]  Problem List: 1. 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   She was originally diagnosed with strep throat. She has had chest discomfort off and on since that time.  Occasionally takes Motrin with relief.  She saw De. Nodi on Sept.   She tried Celexa and valium but stopped  ECG at work - ? Possible pericarditis + pleuretic chest pain, worse with lying back, better with sitting up   No fevers or chills,  No arthritis symptoms No bleeding in her stool  She went to the ER on 1 occasion for CP and NS ST changes  D-dimer was +, CT angio was negative for PE. She was started on Prednisone. Still have some soreness - although its much better.  Was on prednisone for a while, now has tapered off.   Sharp , stabbing CP,  Typically in the middle of her chest  Worse with lying down, better with sitting up  Nonsmoker Occasional ETOH Fhx- mother and father have HTN  Current Outpatient Prescriptions on File Prior to Visit  Medication Sig Dispense Refill  . acetaminophen (TYLENOL) 500 MG tablet Take 500 mg by mouth every 6 (six) hours as needed (pain).     . diazepam (VALIUM) 5 MG tablet Take 5 mg by mouth every 12 (twelve) hours as needed for anxiety.    . diphenhydramine-acetaminophen (TYLENOL PM) 25-500 MG TABS Take 1 tablet by mouth at bedtime as needed (sleep).    Marland Kitchen. ibuprofen (ADVIL,MOTRIN) 600 MG tablet Take 1 tablet (600 mg total) by mouth every 6 (six) hours as needed for pain. 30 tablet 1   No current facility-administered medications on file prior to visit.    Allergies  Allergen Reactions  .  Flagyl [Metronidazole Hcl] Hives  . Morphine And Related Itching    Severe    Past Medical History  Diagnosis Date  . Headache(784.0)   . Depression   . Abnormal Pap smear   . Thyroid nodule   . Hx of varicella     Past Surgical History  Procedure Laterality Date  . Wisdom tooth extraction    . Leep    . Gynecologic cryosurgery      History  Smoking status  . Never Smoker   Smokeless tobacco  . Never Used    History  Alcohol Use No    Family History  Problem Relation Age of Onset  . Other Neg Hx   . Hypertension Mother   . Hypertension Father     Reviw of Systems:  Reviewed in the HPI.  All other systems are negative.  Physical Exam: Blood pressure 114/70, pulse 89, height 5\' 9"  (1.753 m), weight 172 lb 6.4 oz (78.2 kg), SpO2 92 %, unknown if currently breastfeeding. Wt Readings from Last 3 Encounters:  08/11/14 172 lb 6.4 oz (78.2 kg)  07/29/14 174 lb (78.926 kg)  07/29/14 174 lb (78.926 kg)     General: Well developed, well nourished, in no acute distress.  Head: Normocephalic, atraumatic,  sclera non-icteric, mucus membranes are moist,   Neck: Supple. Carotids are 2 + without bruits. No JVD   Lungs: Clear   Heart: RR, normal s1, loud s2, no murmur, no rub.  Abdomen: Soft, non-tender, non-distended with normal bowel sounds.  Msk:  Strength and tone are normal   Extremities: No clubbing or cyanosis. No edema.  Distal pedal pulses are 2+ and equal    Neuro: CN II - XII intact.  Alert and oriented X 3.  Psych:  Normal   ECG: Nov. 3, 2015:  NSR , NS ST abn  Assessment / Plan:

## 2014-08-13 ENCOUNTER — Ambulatory Visit (HOSPITAL_COMMUNITY): Payer: BC Managed Care – PPO | Attending: Cardiovascular Disease | Admitting: Radiology

## 2014-08-13 DIAGNOSIS — I309 Acute pericarditis, unspecified: Secondary | ICD-10-CM | POA: Insufficient documentation

## 2014-08-13 NOTE — Progress Notes (Signed)
Echocardiogram performed.  

## 2014-08-14 ENCOUNTER — Telehealth: Payer: Self-pay | Admitting: *Deleted

## 2014-08-14 NOTE — Telephone Encounter (Signed)
Pt is aware of echo results. Pt would like to know ; if  there is no swelling or fluids in her heart, does she needs to continue taking the colchicine and Motrin? Pt also asked if Dr. Elease HashimotoNahser has any idea why she is still have chest pain.

## 2014-08-18 NOTE — Telephone Encounter (Signed)
The colchicine is for pericarditis - I'm treating her chest pain assuming it is due to pericarditis. If the colchicine is helping, then I would continue, If it has not helped yet, try for 1 more week. Call us back and let us know how you are

## 2014-08-19 NOTE — Telephone Encounter (Signed)
Follow up     Returning Jennifer Mayer's call.  Ok to leave msg on vm.

## 2014-08-19 NOTE — Telephone Encounter (Signed)
Left message for patient to call the office

## 2014-08-19 NOTE — Telephone Encounter (Signed)
Left message of Dr. Harvie BridgeNahser's advice on patient's voice mail per her instruction and advised her to call the office with further questions or concerns

## 2014-08-30 ENCOUNTER — Emergency Department (HOSPITAL_COMMUNITY)
Admission: EM | Admit: 2014-08-30 | Discharge: 2014-08-30 | Disposition: A | Discharge status: Home / Self Care | Payer: BC Managed Care – PPO | Attending: Emergency Medicine | Admitting: Emergency Medicine

## 2014-08-30 ENCOUNTER — Encounter (HOSPITAL_COMMUNITY): Payer: Self-pay

## 2014-08-30 ENCOUNTER — Other Ambulatory Visit: Payer: Self-pay

## 2014-08-30 ENCOUNTER — Emergency Department (HOSPITAL_COMMUNITY): Payer: BC Managed Care – PPO

## 2014-08-30 DIAGNOSIS — R51 Headache: Secondary | ICD-10-CM | POA: Diagnosis not present

## 2014-08-30 DIAGNOSIS — Z8639 Personal history of other endocrine, nutritional and metabolic disease: Secondary | ICD-10-CM | POA: Diagnosis not present

## 2014-08-30 DIAGNOSIS — R011 Cardiac murmur, unspecified: Secondary | ICD-10-CM | POA: Insufficient documentation

## 2014-08-30 DIAGNOSIS — Z8619 Personal history of other infectious and parasitic diseases: Secondary | ICD-10-CM | POA: Diagnosis not present

## 2014-08-30 DIAGNOSIS — R0602 Shortness of breath: Secondary | ICD-10-CM | POA: Insufficient documentation

## 2014-08-30 DIAGNOSIS — Z791 Long term (current) use of non-steroidal anti-inflammatories (NSAID): Secondary | ICD-10-CM | POA: Diagnosis not present

## 2014-08-30 DIAGNOSIS — Z79899 Other long term (current) drug therapy: Secondary | ICD-10-CM | POA: Insufficient documentation

## 2014-08-30 DIAGNOSIS — R600 Localized edema: Secondary | ICD-10-CM | POA: Diagnosis not present

## 2014-08-30 DIAGNOSIS — Z8659 Personal history of other mental and behavioral disorders: Secondary | ICD-10-CM | POA: Insufficient documentation

## 2014-08-30 DIAGNOSIS — R110 Nausea: Secondary | ICD-10-CM | POA: Diagnosis not present

## 2014-08-30 DIAGNOSIS — R079 Chest pain, unspecified: Secondary | ICD-10-CM | POA: Insufficient documentation

## 2014-08-30 MED ORDER — KETOROLAC TROMETHAMINE 30 MG/ML IJ SOLN
30.0000 mg | Freq: Once | INTRAMUSCULAR | Status: AC
  Administered 2014-08-30: 30 mg via INTRAVENOUS

## 2014-08-30 MED ORDER — SODIUM CHLORIDE 0.9 % IV BOLUS (SEPSIS)
1000.0000 mL | Freq: Once | INTRAVENOUS | Status: AC
  Administered 2014-08-30: 1000 mL via INTRAVENOUS

## 2014-08-30 NOTE — ED Notes (Signed)
Per EMS: Pt experiencing chest pain. Awoken from sleep. Recent hx pericarditis , no radiation. Feels like "my heart is racing". Received 325 ASA, Nitro x1. Pain reduced with nitro.

## 2014-08-30 NOTE — ED Provider Notes (Signed)
CSN: [id number]     Arrival date & time 08/30/14  0122 History  This chart was scribed for Joya Gaskinsonald W Chene Kasinger, MD by Elon SpannerGarrett Cook, ED Scribe. This patient was seen in room A05C/A05C and the patient's care was started at 1:42 AM.   Chief Complaint  Patient presents with  . Chest Pain    HPI  HPI Comments: Jennifer Mayer is a 28 y.o. female brought in by ambulance with a history of pericarditis and intermittent CP since March 2015.  She was seen seen by a cardiologist approximately 1 month ago and was prescribed Colchicine 2 mg daily and Motrin 600 mg 3 times daily.  She has been compliant and observed some relief until today's complaint onset.    She presents to the Emergency Department complaining of intermittent, sharp, central CP without radiation onset 9 hours ago.  She reports the pain is aggravated by motion of the upper body but denies aggravation by deep breathing or positioning of upper body.  Patient also complains of mild SOB, nausea, and minor leg swelling.  She reports that the ASA and NTG given by EMS removed Patient denies a history of MI, DVT, PE's.  Patient denies fever, vomiting, cough, hemoptysis. Past Medical History  Diagnosis Date  . Headache(784.0)   . Depression   . Abnormal Pap smear   . Thyroid nodule   . Hx of varicella    Past Surgical History  Procedure Laterality Date  . Wisdom tooth extraction    . Leep    . Gynecologic cryosurgery     Family History  Problem Relation Age of Onset  . Other Neg Hx   . Hypertension Mother   . Hypertension Father    History  Substance Use Topics  . Smoking status: Never Smoker   . Smokeless tobacco: Never Used  . Alcohol Use: No   OB History    Gravida Para Term Preterm AB TAB SAB Ectopic Multiple Living   2 1 1  0 1 0 1 0 0 1     Review of Systems  Constitutional: Negative for fever.  Respiratory: Positive for shortness of breath. Negative for cough.   Cardiovascular: Positive for chest pain.   Gastrointestinal: Positive for nausea. Negative for vomiting.  All other systems reviewed and are negative.     Allergies  Flagyl and Morphine and related  Home Medications   Prior to Admission medications   Medication Sig Start Date End Date Taking? Authorizing Provider  colchicine 0.6 MG tablet Take 1 tablet (0.6 mg total) by mouth 2 (two) times daily. 08/11/14  Yes Vesta MixerPhilip J Nahser, MD  diazepam (VALIUM) 5 MG tablet Take 5 mg by mouth every 12 (twelve) hours as needed for anxiety.   Yes Historical Provider, MD  ibuprofen (ADVIL,MOTRIN) 600 MG tablet Take 1 tablet (600 mg total) by mouth every 6 (six) hours as needed for pain. 11/23/12  Yes Dorien Chihuahuaara J. Richardson Doppole, MD  acetaminophen (TYLENOL) 500 MG tablet Take 500 mg by mouth every 6 (six) hours as needed (pain).     Historical Provider, MD  diphenhydramine-acetaminophen (TYLENOL PM) 25-500 MG TABS Take 1 tablet by mouth at bedtime as needed (sleep).    Historical Provider, MD  Iron-Vitamin C (VITRON-C PO) Take 1 tablet by mouth 2 (two) times daily.    Historical Provider, MD   BP 114/76 mmHg  Pulse 82  Temp(Src) 98 F (36.7 C) (Oral)  Resp 17  SpO2 100% Physical Exam  Nursing note and vitals reviewed.  CONSTITUTIONAL: Well developed/well nourished HEAD: Normocephalic/atraumatic EYES: EOMI/PERRL ENMT: Mucous membranes moist NECK: supple no meningeal signs SPINE/BACK:entire spine nontender CV: S1/S2 noted, faint systolic murmur noted, no rubs/gallops noted CHEST:  Chest wall tenderness with movement of upper body LUNGS: Lungs are clear to auscultation bilaterally, no apparent distress ABDOMEN: soft, nontender, no rebound or guarding, bowel sounds noted throughout abdomen GU:no cva tenderness NEURO: Pt is awake/alert/appropriate, moves all extremitiesx4.  No facial droop.   EXTREMITIES: pulses normal/equal, full ROM, no LE edema/tenderness SKIN: warm, color normal PSYCH: no abnormalities of mood noted, alert and oriented to  situation   ED Course  Procedures   DIAGNOSTIC STUDIES: Oxygen Saturation is 100% on RA, normal by my interpretation.    COORDINATION OF CARE:  1:49 AM Discussed treatment plan with patient at bedside.  Patient acknowledges and agrees with plan.    Pt well appearing EKG does not reveal any acute abnormalities CXR is negative Her pain was reproduced with motion of upper body.  It was not worse with lying flat I doubt this is recurrent pericarditis I have low suspicion for ACS/PE/Dissection She had CT chest on previous visit that was negative Also - she had one reading of 92% pulse ox but this immediately corrected, so I doubt PE She is feeling improved at discharge We discussed strict return precautions She will restart Ibuprofen  BP 114/76 mmHg  Pulse 82  Temp(Src) 98 F (36.7 C) (Oral)  Resp 17  SpO2 100%    Imaging Review Dg Chest 2 View  08/30/2014   CLINICAL DATA:  Shortness of breath, chest pain  EXAM: CHEST  2 VIEW  COMPARISON:  CTA chest dated 07/29/2014  FINDINGS: Lungs are clear.  No pleural effusion or pneumothorax.  The heart is normal in size.  Visualized osseous structures are within normal limits.  IMPRESSION: Normal chest radiographs.   Electronically Signed   By: Charline BillsSriyesh  Krishnan M.D.   On: 08/30/2014 02:48     Date: 08/30/2014 0137am  Rate: 98  Rhythm: normal sinus rhythm  QRS Axis: normal  Intervals: normal  ST/T Wave abnormalities: nonspecific ST changes  Conduction Disutrbances:none   Medications  ketorolac (TORADOL) 30 MG/ML injection 30 mg (30 mg Intravenous Given 08/30/14 0156)  sodium chloride 0.9 % bolus 1,000 mL (0 mLs Intravenous Stopped 08/30/14 0345)     MDM   Final diagnoses:  Chest pain  Chest pain, unspecified chest pain type    Nursing notes including past medical history and social history reviewed and considered in documentation Previous records reviewed and considered   I personally performed the services described in  this documentation, which was scribed in my presence. The recorded information has been reviewed and is accurate.       Joya Gaskinsonald W Kerstie Agent, MD 08/30/14 [phone]

## 2014-08-30 NOTE — Discharge Instructions (Signed)

## 2014-09-04 ENCOUNTER — Telehealth: Payer: Self-pay | Admitting: Cardiovascular Disease

## 2014-09-04 NOTE — Telephone Encounter (Signed)
New message     Pt is still having chest pain----Jennifer Mayer is taking her motrin and colchicine.  Chest pain is less severe than before.  Jennifer Mayer has an appt on the 17th.  This is not a new thing, Dr Elease HashimotoNahser is aware of her chest pain.  Should Jennifer Mayer wait until her appt or try a different medicatin?

## 2014-09-04 NOTE — Telephone Encounter (Signed)
LMTCB

## 2014-09-04 NOTE — Telephone Encounter (Signed)
Called patient about her medication. Informed her to continue her medications as prescribed since she does have some relief from the pain.  Will forward to Dr. Elease HashimotoNahser and Eligha BridegroomMichelle Swinyer RN for further follow-up.

## 2014-09-04 NOTE — Telephone Encounter (Signed)
Continue meds until we see her on the 17th

## 2014-09-05 NOTE — Telephone Encounter (Signed)
Spoke with patient who states she would like to go back to taking Colchicine BID and Ibuprofen 600 mg TID because she feels that she had more relief from these dosages than current dose of Colchicine once daily and Ibuprofen 400 mg TID.  I spoke with Dr. Elease HashimotoNahser who is in the office who states patient may resume Colchicine BID and may resume Ibuprofen 600 mg TID if needed, however he would like to caution on her continuing this dose of Ibuprofen for an extended time due to its potential effects on kidneys and digestive tract.  Patient states she will keep these things in mind as she manages her symptoms and is aware of follow-up on the 12/17 with Dr. Elease HashimotoNahser.  I advised patient to call back with questions or concerns; patient verbalized understanding and agreement.

## 2014-09-09 ENCOUNTER — Ambulatory Visit (HOSPITAL_COMMUNITY): Payer: BC Managed Care – PPO | Attending: Cardiology | Admitting: Radiology

## 2014-09-09 ENCOUNTER — Ambulatory Visit (INDEPENDENT_AMBULATORY_CARE_PROVIDER_SITE_OTHER): Payer: BC Managed Care – PPO | Admitting: Physician Assistant

## 2014-09-09 ENCOUNTER — Encounter: Payer: Self-pay | Admitting: Physician Assistant

## 2014-09-09 ENCOUNTER — Telehealth: Payer: Self-pay | Admitting: *Deleted

## 2014-09-09 VITALS — BP 100/70 | HR 110 | Ht 69.0 in | Wt 174.0 lb

## 2014-09-09 DIAGNOSIS — R079 Chest pain, unspecified: Secondary | ICD-10-CM

## 2014-09-09 DIAGNOSIS — R42 Dizziness and giddiness: Secondary | ICD-10-CM

## 2014-09-09 DIAGNOSIS — I319 Disease of pericardium, unspecified: Secondary | ICD-10-CM | POA: Insufficient documentation

## 2014-09-09 DIAGNOSIS — R55 Syncope and collapse: Secondary | ICD-10-CM

## 2014-09-09 DIAGNOSIS — R000 Tachycardia, unspecified: Secondary | ICD-10-CM

## 2014-09-09 DIAGNOSIS — D649 Anemia, unspecified: Secondary | ICD-10-CM

## 2014-09-09 DIAGNOSIS — R7989 Other specified abnormal findings of blood chemistry: Secondary | ICD-10-CM

## 2014-09-09 LAB — BASIC METABOLIC PANEL
BUN: 14 mg/dL (ref 6–23)
CALCIUM: 9.8 mg/dL (ref 8.4–10.5)
CHLORIDE: 107 meq/L (ref 96–112)
CO2: 22 meq/L (ref 19–32)
CREATININE: 0.9 mg/dL (ref 0.4–1.2)
GFR: 94.2 mL/min (ref >60.00)
GLUCOSE: 90 mg/dL (ref 70–99)
Potassium: 3.9 mEq/L (ref 3.5–5.1)
Sodium: 137 mEq/L (ref 135–145)

## 2014-09-09 LAB — CBC WITH DIFFERENTIAL/PLATELET
BASOS ABS: 0 10*3/uL (ref 0.0–0.1)
Basophils Relative: 0.6 % (ref 0.0–3.0)
Eosinophils Absolute: 0 10*3/uL (ref 0.0–0.7)
Eosinophils Relative: 0.4 % (ref 0.0–5.0)
HEMATOCRIT: 39 % (ref 36.0–46.0)
Hemoglobin: 12.4 g/dL (ref 12.0–15.0)
LYMPHS ABS: 2 10*3/uL (ref 0.7–4.0)
Lymphocytes Relative: 36.7 % (ref 12.0–46.0)
MCHC: 31.8 g/dL (ref 30.0–36.0)
MCV: 90.8 fl (ref 78.0–100.0)
Monocytes Absolute: 0.4 10*3/uL (ref 0.1–1.0)
Monocytes Relative: 6.7 % (ref 3.0–12.0)
Neutro Abs: 3.1 10*3/uL (ref 1.4–7.7)
Neutrophils Relative %: 55.6 % (ref 43.0–77.0)
Platelets: 294 10*3/uL (ref 150.0–400.0)
RBC: 4.3 Mil/uL (ref 3.87–5.11)
RDW: 12.7 % (ref 11.5–15.5)
WBC: 5.6 10*3/uL (ref 4.0–10.5)

## 2014-09-09 LAB — HEPATIC FUNCTION PANEL
ALBUMIN: 4.8 g/dL (ref 3.5–5.2)
ALK PHOS: 34 U/L — AB (ref 39–117)
ALT: 12 U/L (ref 0–35)
AST: 19 U/L (ref 0–37)
BILIRUBIN TOTAL: 0.6 mg/dL (ref 0.2–1.2)
Bilirubin, Direct: 0 mg/dL (ref 0.0–0.3)
Total Protein: 7.8 g/dL (ref 6.0–8.3)

## 2014-09-09 LAB — TSH: TSH: 1.12 u[IU]/mL (ref 0.35–4.50)

## 2014-09-09 LAB — TROPONIN I

## 2014-09-09 MED ORDER — METOPROLOL SUCCINATE ER 25 MG PO TB24: 25.0000 mg | ORAL_TABLET | Freq: Every day | ORAL | Status: DC

## 2014-09-09 NOTE — Telephone Encounter (Signed)
pt notified about STAT Troponin normal. Pt verbalized understanding to results given on this test today.

## 2014-09-09 NOTE — Telephone Encounter (Signed)
pt notified about echo and lab results with verbal understanding to results given today.

## 2014-09-09 NOTE — Patient Instructions (Addendum)
Your physician has requested that you have an LIMITED echocardiogram. Echocardiography is a painless test that uses sound waves to create images of your heart. It provides your doctor with information about the size and shape of your heart and how well your heart's chambers and valves are working. This procedure takes approximately one hour. There are no restrictions for this procedure.  Your physician has requested that you have a lexiscan myoview. For further information please visit [email or web address]. Please follow instruction sheet, as given.  LAB WORK TODAY; BMET, CBC W/DIFF, TSH, LFT AND A STAT TROPONIN   START TOPROL XL 25 MG 1 TABLET DAILY; RX SENT IN TODAY  Your physician recommends that you schedule a follow-up appointment in: 09/11/14 8:15 WITH DR. Elease HashimotoNAHSER

## 2014-09-09 NOTE — Progress Notes (Signed)
Limited Echo for LV Function and Pericardial Effusion.

## 2014-09-09 NOTE — Progress Notes (Signed)
Cardiology Office Note   Date:  09/09/2014   ID:  Waynette ButteryJanese M Mayer, DOB 1985/11/29, MRN [id number]  PCP:  Gretel AcreNNODI, ADAKU, MD  Cardiologist:  Dr. Delane GingerPhil Nahser     History of Present Illness: Jennifer OmanJanese M Mayer is a [age] y.o. female who was evaluated by Dr. Delane GingerPhil Nahser in 07/2014 with acute pericarditis.  She had been treated with Motrin and Prednisone taper but had recurrent symptoms.  She was placed on Colchicine.  She was then seen on 08/30/14 for recurrent chest pain.    She returns today for the evaluation of near syncope with associated rapid palpitations.  This has been going on for several weeks.  She often notes it with standing up and prolonged standing.  She also has had symptoms while sitting.  Most of her symptoms occur upon standing.  She has associate diaphoresis with her symptoms. She denies frank syncope. She continues to have chest pain that is sharp and substernal.  She notes it mainly with positional changes.  She has increased symptoms with lying flat and this is improved with sitting up.  She reports pleuritic symptoms at times.  She notes 3 pillow orthopnea.  No PND.  No edema.     Studies:   - Echo (11/15):  EF 55-60%, normal wall motion, grade 2 diastolic dysfunction, no pericardial effusion   Recent Labs: 07/29/2014: ALT 20; BUN 11; Creatinine 0.89; Hemoglobin 10.8*; Potassium 3.4*; Sodium 143; TSH 7.760*    Recent Radiology: Dg Chest 2 View  08/30/2014   IMPRESSION: Normal chest radiographs.   Electronically Signed   By: Charline BillsSriyesh  Krishnan M.D.   On: 08/30/2014 02:48    Chest CTA (07/29/14):  IMPRESSION:  1. No cavitary lesion is evident. This likely represented a confluence of shadows. At the lungs are clear. 2. No evidence for pulmonary embolus. 3. Negative CTA chest.   Wt Readings from Last 3 Encounters:  09/09/14 174 lb (78.926 kg)  08/11/14 172 lb 6.4 oz (78.2 kg)  07/29/14 174 lb (78.926 kg)     Past Medical History  Diagnosis Date  . Headache(784.0)   .  Depression   . Abnormal Pap smear   . Thyroid nodule   . Hx of varicella     Current Outpatient Prescriptions  Medication Sig Dispense Refill  . acetaminophen (TYLENOL) 500 MG tablet Take 500 mg by mouth every 6 (six) hours as needed (pain).     . colchicine 0.6 MG tablet Take 1 tablet (0.6 mg total) by mouth 2 (two) times daily. 180 tablet 3  . diazepam (VALIUM) 5 MG tablet Take 5 mg by mouth every 12 (twelve) hours as needed for anxiety.    Marland Kitchen. ibuprofen (ADVIL,MOTRIN) 600 MG tablet Take 1 tablet (600 mg total) by mouth every 6 (six) hours as needed for pain. (Patient taking differently: Take 600 mg by mouth 3 (three) times daily. Take 400 -600 mg  3 times daily) 30 tablet 1   No current facility-administered medications for this visit.     Allergies:   Flagyl and Morphine and related   Social History:  The patient  reports that she has never smoked. She has never used smokeless tobacco. She reports that she does not drink alcohol or use illicit drugs.   Family History:  The patient's family history includes Heart attack in her maternal grandfather; Hypertension in her father and mother. There is no history of Other or Stroke.    ROS:  Please see the history of present  illness.   She notes occ chills.  No fever, cough, melena, hematochezia, diarrhea, vomiting.    All other systems reviewed and negative.    PHYSICAL EXAM: VS:  BP 100/70 mmHg  Pulse 110  Ht 5\' 9"  (1.753 m)  Wt 174 lb (78.926 kg)  BMI 25.68 kg/m2  SpO2 99%   Orthostatic VS for the past 24 hrs:  BP- Lying Pulse- Lying BP- Sitting Pulse- Sitting BP- Standing at 0 minutes Pulse- Standing at 0 minutes  09/09/14 1058 132/80 mmHg 74 130/70 mmHg 82 138/85 mmHg 113  after 3 minutes: BP 119/85 Pulse 86  Well nourished, well developed, in no acute distress HEENT: normal Neck: no JVD Cardiac:  normal S1, S2;  RRR; no murmur; no rub, no S3 Lungs:   clear to auscultation bilaterally, no wheezing, rhonchi or rales Abd:  soft, nontender, no hepatomegaly Ext:  no edema Skin: warm and dry Neuro:  CNs 2-12 intact, no focal abnormalities noted  EKG:  Sinus tachy, HR 110, normal axis, subtle TWI inf-lat leads (TW changes appear new since last tracing)      ASSESSMENT AND PLAN:   1.  Near-Syncope:  She has a lot of symptoms that could indicate orthostatic intolerance.  Her BP does drop after prolonged standing and her HR does increase rapidly from lying to standing.  However, she has a recent dx of pericarditis with continued symptoms to suggest persistence.  I could not detect pulsus paradoxus on exam and there is no rub.  Her ECG is different than previous.  I have reviewed her case with Dr. Delane GingerPhil Nahser today.    -  Check Labs:  BMET, CBC, TSH, LFTs, Troponin.    -  Obtain Limited 2D echo to assess for pericardial effusion and to recheck LVF.    -  Add low dose beta blocker with Toprol-XL 25 mg QD to help with HR response.     -  Push fluids and liberalize salt intake slightly.     -  Plan close FU. 2.  Sinus Tachycardia:  Question if related to orthostasis or pericarditis.  TSH was elevated previously but doubt this is related.    -  Repeat TSH. 3.  Chest Pain:  Symptoms consistent with pericarditis.  However, the ECG appears different.  Question if she may have myopericarditis.     -  Check Troponin.  If elevated send to the ED.    -  Arrange Lexiscan Myoview to rule out the possibility of ischemia.   4.  Pericarditis:  Continue Colchicine.  Repeat echo as noted.   5.  Anemia:  This appears to be chronic.  Repeat CBC today.    Disposition:   FU with Dr. Delane GingerPhil Nahser later this week as previously scheduled.    Signed, Brynda RimScott Meelah Tallo, PA-C, MHS 09/09/2014 10:32 AM    [facility] [address], [place], [place]  [address] Phone: [phone]; Fax: [phone]

## 2014-09-10 ENCOUNTER — Ambulatory Visit (HOSPITAL_COMMUNITY): Payer: BC Managed Care – PPO | Attending: Cardiology | Admitting: Radiology

## 2014-09-10 ENCOUNTER — Telehealth: Payer: Self-pay | Admitting: Cardiovascular Disease

## 2014-09-10 DIAGNOSIS — I319 Disease of pericardium, unspecified: Secondary | ICD-10-CM | POA: Insufficient documentation

## 2014-09-10 DIAGNOSIS — R55 Syncope and collapse: Secondary | ICD-10-CM

## 2014-09-10 DIAGNOSIS — R000 Tachycardia, unspecified: Secondary | ICD-10-CM

## 2014-09-10 DIAGNOSIS — R079 Chest pain, unspecified: Secondary | ICD-10-CM

## 2014-09-10 DIAGNOSIS — R42 Dizziness and giddiness: Secondary | ICD-10-CM

## 2014-09-10 MED ORDER — TECHNETIUM TC 99M SESTAMIBI GENERIC - CARDIOLITE
33.0000 | Freq: Once | INTRAVENOUS | Status: AC | PRN
  Administered 2014-09-10: 33 via INTRAVENOUS

## 2014-09-10 MED ORDER — REGADENOSON 0.4 MG/5ML IV SOLN
0.4000 mg | Freq: Once | INTRAVENOUS | Status: AC
  Administered 2014-09-10: 0.4 mg via INTRAVENOUS

## 2014-09-10 MED ORDER — TECHNETIUM TC 99M SESTAMIBI GENERIC - CARDIOLITE
11.0000 | Freq: Once | INTRAVENOUS | Status: AC | PRN
  Administered 2014-09-10: 11 via INTRAVENOUS

## 2014-09-10 NOTE — Telephone Encounter (Signed)
Pt to see Dr. Elease HashimotoNahser tomorrow 12/17 and will address with him then.

## 2014-09-10 NOTE — Progress Notes (Signed)
[facility] SITE 3 NUCLEAR MED [address] [place], [place] [address] [phone]    Cardiology Nuclear Med Study  Jennifer GrossJanese M Jenean Mayer is a 28 y.o. female     MRN : [id number]     DOB: 23-Dec-1985  Procedure Date: 09/10/2014  Nuclear Med Background Indication for Stress Test:  Evaluation for Ischemia,and Abnormal EKG History:  08/30/14 CP/Tachycardia 07/2014 Pericarditis Cardiac Risk Factors: None  Symptoms:  Chest Pain, Diaphoresis, Dizziness, DOE, Fatigue, Near Syncope and Palpitations   Nuclear Pre-Procedure Caffeine/Decaff Intake:  None> 12 hrs NPO After: 9:00pm   Lungs:  clear O2 Sat: 99% on room air. IV 0.9% NS with Angio Cath:  22g  IV Site: R Antecubital x 1, tolerated well IV Started by:  Jennifer HongPatsy Edwards, RN  Chest Size (in):  38 Cup Size: D  Height: 5\' 9"  (1.753 m)  Weight:  174 lb (78.926 kg)  BMI:  Body mass index is 25.68 kg/(m^2). Tech Comments:  Patient took last dose of Toprol at 1:00pm yesterday. Jennifer HongPatsy Edwards, RN.    Nuclear Med Study 1 or 2 day study: 1 day  Stress Test Type:  Treadmill/Lexiscan  Reading MD: N/A  Order Authorizing Provider:  Kristeen MissPhilip Nahser, MD, and Tereso NewcomerScott Weaver, PAC  Resting Radionuclide: Technetium 1713m Sestamibi  Resting Radionuclide Dose: 11.0 mCi   Stress Radionuclide:  Technetium 113m Sestamibi  Stress Radionuclide Dose: 33.0 mCi           Stress Protocol Rest HR: 65 Stress HR: 136  Rest BP: 102/72 Stress BP: 119/71  Exercise Time (min): n/a METS: n/a   Predicted Max HR: 190 bpm % Max HR: 71.58 bpm Rate Pressure Product: [id number]   Dose of Adenosine (mg):  n/a Dose of Lexiscan: 0.4 mg  Dose of Atropine (mg): n/a Dose of Dobutamine: n/a mcg/kg/min (at max HR)  Stress Test Technologist: Jennifer Mayer, EMT-P  Nuclear Technologist:  Jennifer Mayer, CNMT     Rest Procedure:  Myocardial perfusion imaging was performed at rest 45 minutes following the intravenous administration of Technetium 7613m Sestamibi. Rest ECG: NSR -  Normal EKG  Stress Procedure:  The patient received IV Lexiscan 0.4 mg over 15-seconds.  Technetium 4213m Sestamibi injected at 30-seconds. This pt was dizzy, hot, lt. Headed, with leg heaviness from the Lexiscan injection.  Quantitative spect images were obtained after a 45 minute delay. Stress ECG: No significant change from baseline ECG  QPS Raw Data Images:  There is a breast shadow that accounts for the anterior attenuation. Stress Images:  there is a small area of anteroapical mild reduction in tracer uptake Rest Images:  Comparison with the stress images reveals no significant change. Subtraction (SDS):  There is a small fixed anterior defect that is most consistent with breast attenuation. Transient Ischemic Dilatation (Normal <1.22):  0.90 Lung/Heart Ratio (Normal <0.45):  0.36  Quantitative Gated Spect Images QGS EDV:  81 ml QGS ESV:  21 ml  Impression Exercise Capacity:  Lexiscan with no exercise. BP Response:  Normal blood pressure response. Clinical Symptoms:  No significant symptoms noted. ECG Impression:  Sinus tachycardia and prominent ST depression and T wave inversion in inferior and lateral leads during Lexiscan infusion Comparison with Prior Nuclear Study: No previous nuclear study performed  Overall Impression:  Low risk stress nuclear study with mild breast attenuation artifact. ECG changes during Lexiscan infusion were prominent, but nonspecific  LV Ejection Fraction: 75%.  LV Wall Motion:  NL LV Function; NL Wall Motion   Jennifer Hatchel,  MD, [facility] [phone] office [phone] pager

## 2014-09-10 NOTE — Telephone Encounter (Signed)
New message      Pt want a note to be on light duty for 2-3wks until the metoprolol kicks in.  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

## 2014-09-11 ENCOUNTER — Encounter: Payer: Self-pay | Admitting: Cardiovascular Disease

## 2014-09-11 ENCOUNTER — Ambulatory Visit: Payer: BC Managed Care – PPO | Admitting: Cardiovascular Disease

## 2014-09-11 ENCOUNTER — Encounter: Payer: Self-pay | Admitting: *Deleted

## 2014-09-11 ENCOUNTER — Ambulatory Visit (INDEPENDENT_AMBULATORY_CARE_PROVIDER_SITE_OTHER): Payer: BC Managed Care – PPO | Admitting: Cardiovascular Disease

## 2014-09-11 VITALS — BP 104/80 | HR 80 | Ht 69.0 in | Wt 172.1 lb

## 2014-09-11 DIAGNOSIS — R000 Tachycardia, unspecified: Secondary | ICD-10-CM

## 2014-09-11 DIAGNOSIS — I471 Supraventricular tachycardia: Secondary | ICD-10-CM

## 2014-09-11 DIAGNOSIS — I309 Acute pericarditis, unspecified: Secondary | ICD-10-CM

## 2014-09-11 MED ORDER — METOPROLOL SUCCINATE ER 50 MG PO TB24: 50.0000 mg | ORAL_TABLET | Freq: Every day | ORAL | Status: DC

## 2014-09-11 NOTE — Assessment & Plan Note (Signed)
Jennifer CritchleyJanese continues to have some CP - seems to be primarily with movement.   She has been treated for a month or so with motrin and colchicine. At this point, I think the pain may be due to a cause other than pericarditis. I've encouraged her to contact her medical doctor for a referral to a pulmonoloist or rheumatologist.   Will give her a note for light duty for 1 month.  Will see her in 3 months.

## 2014-09-11 NOTE — Assessment & Plan Note (Signed)
She continues to have episodes of tachycardia on occasion. We will increase the Toprol-XL to 50 mg a day.   She will call us if  she has any weakness or dizziness.

## 2014-09-11 NOTE — Progress Notes (Signed)
[name] Date of Birth  05/04/1986       [facility]    [place] [facility] [address]  [address] [place], [place]  [address]   [place], [place]  [address] [phone]     [phone]   Fax  [phone]     Fax [phone]  Problem List: 1. 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   She was originally diagnosed with strep throat. She has had chest discomfort off and on since that time.  Occasionally takes Motrin with relief.  She saw De. Nodi on Sept.   She tried Celexa and valium but stopped  ECG at work - ? Possible pericarditis + pleuretic chest pain, worse with lying back, better with sitting up   No fevers or chills,  No arthritis symptoms No bleeding in her stool  She went to the ER on 1 occasion for CP and NS ST changes  D-dimer was +, CT angio was negative for PE. She was started on Prednisone. Still have some soreness - although its much better.  Was on prednisone for a while, now has tapered off.   Sharp , stabbing CP,  Typically in the middle of her chest  Worse with lying down, better with sitting up  Nonsmoker Occasional ETOH Fhx- mother and father have HTN  Dec. 17, 2015: Jennifer Mayer is a 28 yo with pleuretic CP assumed to be pericarditis.  She was seen by Lorin PicketScott several days ago. Still having some CP.  A stress myvoiew was done and is normal.  The pain has improved quite a bit. Seems to be worse with movement.   Still on colchicine 0.6 BID and motrin 400-600 TID.     Current Outpatient Prescriptions on File Prior to Visit  Medication Sig Dispense Refill  . acetaminophen (TYLENOL) 500 MG tablet Take 500 mg by mouth every 6 (six) hours as needed (pain).     . colchicine 0.6 MG tablet Take 1 tablet (0.6 mg total) by mouth 2 (two) times daily. 180 tablet 3  . ibuprofen (ADVIL,MOTRIN) 600 MG tablet  Take 1 tablet (600 mg total) by mouth every 6 (six) hours as needed for pain. (Patient taking differently: Take 600 mg by mouth 3 (three) times daily. Take 400 -600 mg  3 times daily) 30 tablet 1  . metoprolol succinate (TOPROL-XL) 25 MG 24 hr tablet Take 1 tablet (25 mg total) by mouth daily. 30 tablet 11  . diazepam (VALIUM) 5 MG tablet Take 5 mg by mouth every 12 (twelve) hours as needed for anxiety.     No current facility-administered medications on file prior to visit.    Allergies  Allergen Reactions  . Flagyl [Metronidazole Hcl] Hives  . Morphine And Related Itching    Severe    Past Medical History  Diagnosis Date  . Headache(784.0)   . Depression   . Abnormal Pap smear   . Thyroid nodule   . Hx of varicella   . Hx of echocardiogram     Echo (12/15):  EF 60-65%, no RWMA, no effusion    Past Surgical History  Procedure Laterality Date  . Wisdom tooth extraction    . Leep    . Gynecologic cryosurgery      History  Smoking status  . Never Smoker   Smokeless  tobacco  . Never Used    History  Alcohol Use No    Family History  Problem Relation Age of Onset  . Other Neg Hx   . Hypertension Mother   . Hypertension Father   . Heart attack Maternal Grandfather   . Stroke Neg Hx     Reviw of Systems:  Reviewed in the HPI.  All other systems are negative.  Physical Exam: Blood pressure 104/80, pulse 80, height 5\' 9"  (1.753 m), weight 172 lb 1.9 oz (78.073 kg), unknown if currently breastfeeding. Wt Readings from Last 3 Encounters:  09/11/14 172 lb 1.9 oz (78.073 kg)  09/10/14 174 lb (78.926 kg)  09/09/14 174 lb (78.926 kg)     General: Well developed, well nourished, in no acute distress.  Head: Normocephalic, atraumatic, sclera non-icteric, mucus membranes are moist,   Neck: Supple. Carotids are 2 + without bruits. No JVD   Lungs: Clear   Heart: RR, normal s1, loud s2, no murmur, no rub.  Abdomen: Soft, non-tender, non-distended with normal  bowel sounds.  Msk:  Strength and tone are normal   Extremities: No clubbing or cyanosis. No edema.  Distal pedal pulses are 2+ and equal    Neuro: CN II - XII intact.  Alert and oriented X 3.  Psych:  Normal   ECG: Nov. 3, 2015:  NSR , NS ST abn  Assessment / Plan:

## 2014-09-11 NOTE — Patient Instructions (Signed)
Your physician has recommended you make the following change in your medication:  1) INCREASE Toprol to 50 mg daily  Your physician recommends that you schedule a follow-up appointment in: 3 months with Dr. Nahser. 

## 2014-09-17 ENCOUNTER — Other Ambulatory Visit (HOSPITAL_COMMUNITY)
Admission: RE | Admit: 2014-09-17 | Discharge: 2014-09-17 | Disposition: A | Discharge status: Home / Self Care | Payer: BC Managed Care – PPO | Source: Ambulatory Visit | Attending: Obstetrics and Gynecology | Admitting: Obstetrics and Gynecology

## 2014-09-17 ENCOUNTER — Other Ambulatory Visit: Payer: Self-pay | Admitting: Obstetrics and Gynecology

## 2014-09-17 DIAGNOSIS — Z1151 Encounter for screening for human papillomavirus (HPV): Secondary | ICD-10-CM | POA: Insufficient documentation

## 2014-09-17 DIAGNOSIS — Z01419 Encounter for gynecological examination (general) (routine) without abnormal findings: Secondary | ICD-10-CM | POA: Diagnosis not present

## 2014-09-18 ENCOUNTER — Emergency Department (HOSPITAL_BASED_OUTPATIENT_CLINIC_OR_DEPARTMENT_OTHER)
Admission: EM | Admit: 2014-09-18 | Discharge: 2014-09-18 | Disposition: A | Discharge status: Home / Self Care | Payer: BC Managed Care – PPO | Attending: Emergency Medicine | Admitting: Emergency Medicine

## 2014-09-18 ENCOUNTER — Emergency Department (HOSPITAL_BASED_OUTPATIENT_CLINIC_OR_DEPARTMENT_OTHER): Payer: BC Managed Care – PPO

## 2014-09-18 ENCOUNTER — Encounter (HOSPITAL_BASED_OUTPATIENT_CLINIC_OR_DEPARTMENT_OTHER): Payer: Self-pay | Admitting: Emergency Medicine

## 2014-09-18 DIAGNOSIS — Z79899 Other long term (current) drug therapy: Secondary | ICD-10-CM | POA: Insufficient documentation

## 2014-09-18 DIAGNOSIS — Z3202 Encounter for pregnancy test, result negative: Secondary | ICD-10-CM | POA: Diagnosis not present

## 2014-09-18 DIAGNOSIS — T50905A Adverse effect of unspecified drugs, medicaments and biological substances, initial encounter: Secondary | ICD-10-CM

## 2014-09-18 DIAGNOSIS — T447X5A Adverse effect of beta-adrenoreceptor antagonists, initial encounter: Secondary | ICD-10-CM | POA: Insufficient documentation

## 2014-09-18 DIAGNOSIS — Z8639 Personal history of other endocrine, nutritional and metabolic disease: Secondary | ICD-10-CM | POA: Insufficient documentation

## 2014-09-18 DIAGNOSIS — F419 Anxiety disorder, unspecified: Secondary | ICD-10-CM | POA: Insufficient documentation

## 2014-09-18 DIAGNOSIS — F329 Major depressive disorder, single episode, unspecified: Secondary | ICD-10-CM | POA: Insufficient documentation

## 2014-09-18 DIAGNOSIS — R52 Pain, unspecified: Secondary | ICD-10-CM

## 2014-09-18 DIAGNOSIS — R0602 Shortness of breath: Secondary | ICD-10-CM | POA: Insufficient documentation

## 2014-09-18 DIAGNOSIS — Z8619 Personal history of other infectious and parasitic diseases: Secondary | ICD-10-CM | POA: Insufficient documentation

## 2014-09-18 DIAGNOSIS — R001 Bradycardia, unspecified: Secondary | ICD-10-CM | POA: Insufficient documentation

## 2014-09-18 LAB — TROPONIN I

## 2014-09-18 LAB — CBC WITH DIFFERENTIAL/PLATELET
BASOS PCT: 0 % (ref 0–1)
Basophils Absolute: 0 10*3/uL (ref 0.0–0.1)
Eosinophils Absolute: 0 10*3/uL (ref 0.0–0.7)
Eosinophils Relative: 0 % (ref 0–5)
HCT: 36.2 % (ref 36.0–46.0)
Hemoglobin: 11.7 g/dL — ABNORMAL LOW (ref 12.0–15.0)
LYMPHS ABS: 1.5 10*3/uL (ref 0.7–4.0)
Lymphocytes Relative: 12 % (ref 12–46)
MCH: 29.5 pg (ref 26.0–34.0)
MCHC: 32.3 g/dL (ref 30.0–36.0)
MCV: 91.4 fL (ref 78.0–100.0)
Monocytes Absolute: 0.6 10*3/uL (ref 0.1–1.0)
Monocytes Relative: 5 % (ref 3–12)
NEUTROS PCT: 83 % — AB (ref 43–77)
Neutro Abs: 10.7 10*3/uL — ABNORMAL HIGH (ref 1.7–7.7)
PLATELETS: 289 10*3/uL (ref 150–400)
RBC: 3.96 MIL/uL (ref 3.87–5.11)
RDW: 12.3 % (ref 11.5–15.5)
WBC: 12.8 10*3/uL — AB (ref 4.0–10.5)

## 2014-09-18 LAB — BASIC METABOLIC PANEL
ANION GAP: 9 (ref 5–15)
BUN: 19 mg/dL (ref 6–23)
CHLORIDE: 104 meq/L (ref 96–112)
CO2: 24 mmol/L (ref 19–32)
CREATININE: 0.81 mg/dL (ref 0.50–1.10)
Calcium: 9.9 mg/dL (ref 8.4–10.5)
GFR calc Af Amer: >90 mL/min (ref >90)
Glucose, Bld: 134 mg/dL — ABNORMAL HIGH (ref 70–99)
Potassium: 4 mmol/L (ref 3.5–5.1)
Sodium: 137 mmol/L (ref 135–145)

## 2014-09-18 LAB — D-DIMER, QUANTITATIVE: D-Dimer, Quant: 0.32 ug/mL-FEU (ref 0.00–0.48)

## 2014-09-18 LAB — PREGNANCY, URINE: Preg Test, Ur: NEGATIVE

## 2014-09-18 MED ORDER — IBUPROFEN 800 MG PO TABS: 800.0000 mg | ORAL_TABLET | Freq: Three times a day (TID) | ORAL | Status: DC

## 2014-09-18 NOTE — ED Notes (Signed)
Patient transported to X-ray 

## 2014-09-18 NOTE — ED Provider Notes (Signed)
CSN: [id number]     Arrival date & time 09/18/14  0219 History   First MD Initiated Contact with Patient 09/18/14 0257     Chief Complaint  Patient presents with  . Shortness of Breath     (Consider location/radiation/quality/duration/timing/severity/associated sxs/prior Treatment) Patient is a 28 y.o. female presenting with shortness of breath. The history is provided by the patient.  Shortness of Breath Severity:  Moderate Onset quality:  Sudden Timing:  Constant Progression:  Unchanged Chronicity:  New Context: not known allergens   Relieved by:  Nothing Worsened by:  Nothing tried Ineffective treatments:  None tried Associated symptoms: no chest pain, no cough, no fever, no sore throat and no wheezing   Risk factors: no hx of PE/DVT and no recent surgery   Was having tachycardia and palpitations and placed on Toprol but patient reports side effects and this week was changed to Inderal.  Tonight has had bradycardia and SOB.  Also reports ongoing calf pain  Past Medical History  Diagnosis Date  . Headache(784.0)   . Depression   . Abnormal Pap smear   . Thyroid nodule   . Hx of varicella   . Hx of echocardiogram     Echo (12/15):  EF 60-65%, no RWMA, no effusion  . Hx of cardiovascular stress test     Lexiscan Myoview (12/15):  EF 75%, breast atten, Low Risk   Past Surgical History  Procedure Laterality Date  . Wisdom tooth extraction    . Leep    . Gynecologic cryosurgery     Family History  Problem Relation Age of Onset  . Other Neg Hx   . Hypertension Mother   . Hypertension Father   . Heart attack Maternal Grandfather   . Stroke Neg Hx    History  Substance Use Topics  . Smoking status: Never Smoker   . Smokeless tobacco: Never Used  . Alcohol Use: No   OB History    Gravida Para Term Preterm AB TAB SAB Ectopic Multiple Living   2 1 1  0 1 0 1 0 0 1     Review of Systems  Constitutional: Negative for fever.  HENT: Negative for sore throat.    Respiratory: Positive for shortness of breath. Negative for cough, choking, chest tightness, wheezing and stridor.   Cardiovascular: Negative for chest pain, palpitations and leg swelling.  All other systems reviewed and are negative.     Allergies  Flagyl and Morphine and related  Home Medications   Prior to Admission medications   Medication Sig Start Date End Date Taking? Authorizing Provider  propranolol (INDERAL) 60 MG tablet Take 60 mg by mouth daily.   Yes Historical Provider, MD  acetaminophen (TYLENOL) 500 MG tablet Take 500 mg by mouth every 6 (six) hours as needed (pain).     Historical Provider, MD  colchicine 0.6 MG tablet Take 1 tablet (0.6 mg total) by mouth 2 (two) times daily. 08/11/14   Vesta MixerPhilip J Nahser, MD  diazepam (VALIUM) 5 MG tablet Take 5 mg by mouth every 12 (twelve) hours as needed for anxiety.    Historical Provider, MD  ibuprofen (ADVIL,MOTRIN) 600 MG tablet Take 1 tablet (600 mg total) by mouth every 6 (six) hours as needed for pain. Patient taking differently: Take 600 mg by mouth 3 (three) times daily. Take 400 -600 mg  3 times daily 11/23/12   Dorien Chihuahuaara J. Richardson Doppole, MD  metoprolol succinate (TOPROL-XL) 50 MG 24 hr tablet Take 1 tablet (50 mg  total) by mouth daily. 09/11/14   Vesta MixerPhilip J Nahser, MD   BP 143/99 mmHg  Pulse 60  Temp(Src) 98.6 F (37 C) (Oral)  Resp 14  Ht 5\' 9"  (1.753 m)  Wt 174 lb (78.926 kg)  BMI 25.68 kg/m2  SpO2 100% Physical Exam  Constitutional: She is oriented to person, place, and time. She appears well-developed and well-nourished. No distress.  HENT:  Head: Normocephalic and atraumatic.  Mouth/Throat: Oropharynx is clear and moist.  Eyes: Conjunctivae are normal. Pupils are equal, round, and reactive to light.  Neck: Normal range of motion. Neck supple.  Cardiovascular: Regular rhythm.  Bradycardia present.   Pulmonary/Chest: Effort normal and breath sounds normal. No respiratory distress. She has no wheezes. She has no rales. She  exhibits no tenderness.  Abdominal: Soft. Bowel sounds are normal. There is no tenderness. There is no rebound and no guarding.  Musculoskeletal: Normal range of motion. She exhibits no edema or tenderness.  No cords  Neurological: She is alert and oriented to person, place, and time.  Skin: Skin is warm and dry.  Psychiatric: Her mood appears anxious.    ED Course  Procedures (including critical care time) Labs Review Labs Reviewed  CBC WITH DIFFERENTIAL - Abnormal; Notable for the following:    WBC 12.8 (*)    Hemoglobin 11.7 (*)    Neutrophils Relative % 83 (*)    Neutro Abs 10.7 (*)    All other components within normal limits  BASIC METABOLIC PANEL - Abnormal; Notable for the following:    Glucose, Bld 134 (*)    All other components within normal limits  PREGNANCY, URINE  TROPONIN I  D-DIMER, QUANTITATIVE    Imaging Review Dg Chest 2 View  09/18/2014   CLINICAL DATA:  Acute onset of shortness of breath and bradycardia for 2 hours. Initial encounter.  EXAM: CHEST  2 VIEW  COMPARISON:  Chest radiograph performed 08/30/2014  FINDINGS: The lungs are well-aerated and clear. There is no evidence of focal opacification, pleural effusion or pneumothorax.  The heart is normal in size; the mediastinal contour is within normal limits. No acute osseous abnormalities are seen.  IMPRESSION: No acute cardiopulmonary process seen.   Electronically Signed   By: Roanna RaiderJeffery  Chang M.D.   On: 09/18/2014 04:20     EKG Interpretation   Date/Time:  Thursday September 18 2014 02:50:29 EST Ventricular Rate:  54 PR Interval:  142 QRS Duration: 82 QT Interval:  396 QTC Calculation: 375 R Axis:   62 Text Interpretation:  Sinus bradycardia Confirmed by [facility]  MD,  Fatisha Rabalais ([id number]) on 09/18/2014 4:27:00 AM      MDM   Final diagnoses:  Medication adverse effect, initial encounter    Medication related.  Propranolol slowed HR and caused patient to feel SOB cardiacs, EKG, Ddimer all  negative.  Call Dr. Ihor DowNnodi in am regarding your Beta blocker.  Hold next dose of propranolol.      Jasmine AweApril K Gerilynn Mccullars-Rasch, MD 09/18/14 [phone]

## 2014-09-18 NOTE — ED Notes (Signed)
SOB and low HR x2-3 hrs.

## 2014-09-22 LAB — CYTOLOGY - PAP

## 2014-10-06 ENCOUNTER — Ambulatory Visit (INDEPENDENT_AMBULATORY_CARE_PROVIDER_SITE_OTHER): Payer: BLUE CROSS/BLUE SHIELD | Admitting: Pulmonary Disease

## 2014-10-06 ENCOUNTER — Encounter: Payer: Self-pay | Admitting: Pulmonary Disease

## 2014-10-06 VITALS — BP 118/68 | HR 88 | Ht 69.0 in | Wt 177.0 lb

## 2014-10-06 DIAGNOSIS — R0602 Shortness of breath: Secondary | ICD-10-CM | POA: Insufficient documentation

## 2014-10-06 DIAGNOSIS — R0789 Other chest pain: Secondary | ICD-10-CM

## 2014-10-06 DIAGNOSIS — R079 Chest pain, unspecified: Secondary | ICD-10-CM | POA: Insufficient documentation

## 2014-10-06 NOTE — Progress Notes (Signed)
Subjective:    Patient ID: Jennifer OmanJanese M Mayer, female    DOB: 19-Mar-1986, 29 y.o.   MRN: [id number]  HPI  Chief Complaint  Patient presents with  . Advice Only    [facility], sob.  Pt has had full cardiac workup hat has proved negative by Dr. Elease HashimotoNahser.    Jennifer CritchleyJanese developed pain in her chest in March of 2015 and was diagnosed ith pericarditis.  She was treated for that with prednisone and colchicine with motrin.  When she was taking the medications the pain improved "a little".  She says that the only thing that competely knocked out the pain was prednisone.    The chest pain preceeded the shortness of breath.  However "a few weeks ago" she started having shortness of breath.  The chest pain is "sharp or dull" and migrates around her chest and has no clear reproducing conditions.  It does not change with eating.  It occurs at both rest and exertion.  Exercise doesn't reproduce it.  She doesn't have the pain now since stopping prednisone 12/26.  However about 2-3 weeks ago she started having dyspnea.  She thought this might be related to a b-blocker she was started.  She feels throat tightness.  Last night she took a nexium.  She was prescribed albuterol 2-3 weeks ago for the dyspnea, but she says that it didn't help.  NO recent URI's.  This whole illness started after strep in March. The shortness of breath has been the same, not worsening.  No difference going outside.  She has been exerciseing, did elliptical for 20 minutes.  Never smoked, no family history of lung problems, no respiratory complaints.    Past Medical History  Diagnosis Date  . Headache(784.0)   . Depression   . Abnormal Pap smear   . Thyroid nodule   . Hx of varicella   . Hx of echocardiogram     Echo (12/15):  EF 60-65%, no RWMA, no effusion  . Hx of cardiovascular stress test     Lexiscan Myoview (12/15):  EF 75%, breast atten, Low Risk     Family History  Problem Relation Age of Onset    . Other Neg Hx   . Hypertension Mother   . Hypertension Father   . Heart attack Maternal Grandfather   . Stroke Neg Hx      History   Social History  . Marital Status: Married    Spouse Name: N/A    Number of Children: N/A  . Years of Education: N/A   Occupational History  . Not on file.   Social History Main Topics  . Smoking status: Never Smoker   . Smokeless tobacco: Never Used  . Alcohol Use: No  . Drug Use: No  . Sexual Activity: Yes    Birth Control/ Protection: IUD   Other Topics Concern  . Not on file   Social History Narrative     Allergies  Allergen Reactions  . Flagyl [Metronidazole Hcl] Hives  . Morphine And Related Itching    Severe     Outpatient Prescriptions Prior to Visit  Medication Sig Dispense Refill  . acetaminophen (TYLENOL) 500 MG tablet Take 500 mg by mouth every 6 (six) hours as needed (pain).     Marland Kitchen. ibuprofen (ADVIL,MOTRIN) 800 MG tablet Take 1 tablet (800 mg total) by mouth 3 (three) times daily. 21 tablet 0  . propranolol (INDERAL) 60 MG tablet Take 10 mg by  mouth daily. 1-2 tablets twice daily    . ibuprofen (ADVIL,MOTRIN) 600 MG tablet Take 1 tablet (600 mg total) by mouth every 6 (six) hours as needed for pain. (Patient taking differently: Take 600 mg by mouth 3 (three) times daily. Take 400 -600 mg  3 times daily) 30 tablet 1  . colchicine 0.6 MG tablet Take 1 tablet (0.6 mg total) by mouth 2 (two) times daily. (Patient not taking: Reported on 10/06/2014) 180 tablet 3  . diazepam (VALIUM) 5 MG tablet Take 5 mg by mouth every 12 (twelve) hours as needed for anxiety.    . metoprolol succinate (TOPROL-XL) 50 MG 24 hr tablet Take 1 tablet (50 mg total) by mouth daily. (Patient not taking: Reported on 10/06/2014) 30 tablet 3   No facility-administered medications prior to visit.     Review of Systems  Constitutional: Negative for fever and unexpected weight change.  HENT: Positive for postnasal drip. Negative for congestion, dental  problem, ear pain, nosebleeds, rhinorrhea, sinus pressure, sneezing, sore throat and trouble swallowing.   Eyes: Negative for redness and itching.  Respiratory: Positive for chest tightness and shortness of breath. Negative for cough and wheezing.   Cardiovascular: Negative for palpitations and leg swelling.  Gastrointestinal: Negative for nausea and vomiting.  Genitourinary: Negative for dysuria.  Musculoskeletal: Negative for joint swelling.  Skin: Negative for rash.  Neurological: Negative for headaches.  Hematological: Does not bruise/bleed easily.  Psychiatric/Behavioral: Negative for dysphoric mood. The patient is not nervous/anxious.        Objective:   Physical Exam  Filed Vitals:   10/06/14 1501  BP: 118/68  Pulse: 88  Height:  (1.753 m)  Weight: 177 lb (80.287 kg)  SpO2: 97%   RA  Ambulated 500 feet on room air and her O2 saturation remained above 97%  Gen: well appearing, no acute distress HEENT: NCAT, PERRL, EOMi, OP clear, neck supple without masses PULM: CTA B CV: RRR, no mgr, no JVD AB: BS+, soft, nontender, no hsm Ext: warm, no edema, no clubbing, no cyanosis Derm: no rash or skin breakdown Neuro: A&Ox4, CN II-XII intact, strength 5/5 in all 4 extremities  11/2013 CT chest images reviewed today in clinic> normal lung parenchyma, no pulmonary embolism, normal cardiac contours, no pleural or mediastinal disease noted Records from cardiology were reviewed today in clinic     Assessment & Plan:   Shortness of breath She is complaining of shortness of breath on exertion but today on exam she has normal lungs, and her oximetry was normal on ambulation and at rest. Further, I have reviewed the images from the CT chest from March 2015 and saw no evidence of lung or mediastinal or vascular disease. Objectively I cannot find an abnormality. I agree with the workup thus far which has included a stress test and an echocardiogram which were normal. I have reviewed  the cardiology records where she was treated for pericarditis. I do not think at this time that she has pericarditis.  I explained to her today that because of the sensation of a tight throat and hoarseness with the patulous esophagus seen on the CT chest I believe it's very likely she has gastroesophageal reflux disease which is an explanation for both her chest tightness as well as the shortness of breath.  Plan: -For completeness sake we will obtain a full pulmonary function test -Start treatment for gastroesophageal reflux disease with a H2 blocker as well as lifestyle modification -Follow-up 4 weeks  Chest pain  The workup until this point has been quite thorough. There has been no evidence of ischemic disease or a pulmonary embolism. Further, I do not see clear evidence of other pathology from the CT chest. I believe that the chest pain is related to gastroesophageal reflux disease.   Updated Medication List Outpatient Encounter Prescriptions as of 10/06/2014  Medication Sig  . acetaminophen (TYLENOL) 500 MG tablet Take 500 mg by mouth every 6 (six) hours as needed (pain).   Marland Kitchen ibuprofen (ADVIL,MOTRIN) 800 MG tablet Take 1 tablet (800 mg total) by mouth 3 (three) times daily.  . propranolol (INDERAL) 60 MG tablet Take 10 mg by mouth daily. 1-2 tablets twice daily  . [DISCONTINUED] ibuprofen (ADVIL,MOTRIN) 600 MG tablet Take 1 tablet (600 mg total) by mouth every 6 (six) hours as needed for pain. (Patient taking differently: Take 600 mg by mouth 3 (three) times daily. Take 400 -600 mg  3 times daily)  . [DISCONTINUED] colchicine 0.6 MG tablet Take 1 tablet (0.6 mg total) by mouth 2 (two) times daily. (Patient not taking: Reported on 10/06/2014)  . [DISCONTINUED] diazepam (VALIUM) 5 MG tablet Take 5 mg by mouth every 12 (twelve) hours as needed for anxiety.  . [DISCONTINUED] metoprolol succinate (TOPROL-XL) 50 MG 24 hr tablet Take 1 tablet (50 mg total) by mouth daily. (Patient not taking:  Reported on 10/06/2014)

## 2014-10-06 NOTE — Assessment & Plan Note (Signed)
The workup until this point has been quite thorough. There has been no evidence of ischemic disease or a pulmonary embolism. Further, I do not see clear evidence of other pathology from the CT chest. I believe that the chest pain is related to gastroesophageal reflux disease.

## 2014-10-06 NOTE — Patient Instructions (Signed)
Follow the GERD lifestyle modifications we gave you We will arrange a pulmonary function test and call you with the results Take a zantac daily We will see you back in 4-6 weeks or sooner if needed

## 2014-10-06 NOTE — Assessment & Plan Note (Signed)
She is complaining of shortness of breath on exertion but today on exam she has normal lungs, and her oximetry was normal on ambulation and at rest. Further, I have reviewed the images from the CT chest from March 2015 and saw no evidence of lung or mediastinal or vascular disease. Objectively I cannot find an abnormality. I agree with the workup thus far which has included a stress test and an echocardiogram which were normal. I have reviewed the cardiology records where she was treated for pericarditis. I do not think at this time that she has pericarditis.  I explained to her today that because of the sensation of a tight throat and hoarseness with the patulous esophagus seen on the CT chest I believe it's very likely she has gastroesophageal reflux disease which is an explanation for both her chest tightness as well as the shortness of breath.  Plan: -For completeness sake we will obtain a full pulmonary function test -Start treatment for gastroesophageal reflux disease with a H2 blocker as well as lifestyle modification -Follow-up 4 weeks

## 2014-10-14 ENCOUNTER — Ambulatory Visit: Payer: BC Managed Care – PPO | Admitting: Cardiovascular Disease

## 2014-11-05 ENCOUNTER — Ambulatory Visit: Payer: BLUE CROSS/BLUE SHIELD | Admitting: Pulmonary Disease

## 2014-12-09 ENCOUNTER — Encounter: Payer: BC Managed Care – PPO | Admitting: Cardiovascular Disease

## 2014-12-09 NOTE — Progress Notes (Signed)
This encounter was created in error - please disregard.

## 2015-03-16 ENCOUNTER — Ambulatory Visit: Payer: BLUE CROSS/BLUE SHIELD | Admitting: Family Medicine

## 2015-05-22 ENCOUNTER — Ambulatory Visit: Payer: BLUE CROSS/BLUE SHIELD | Admitting: Family Medicine

## 2015-06-26 IMAGING — CR DG CHEST 2V
2 series · 2 of 2 positions shown · non-contrast
Comparison: CTA chest dated 07/29/2014

CLINICAL DATA: Shortness of breath, chest pain

EXAM:
CHEST  2 VIEW

[chest pa]
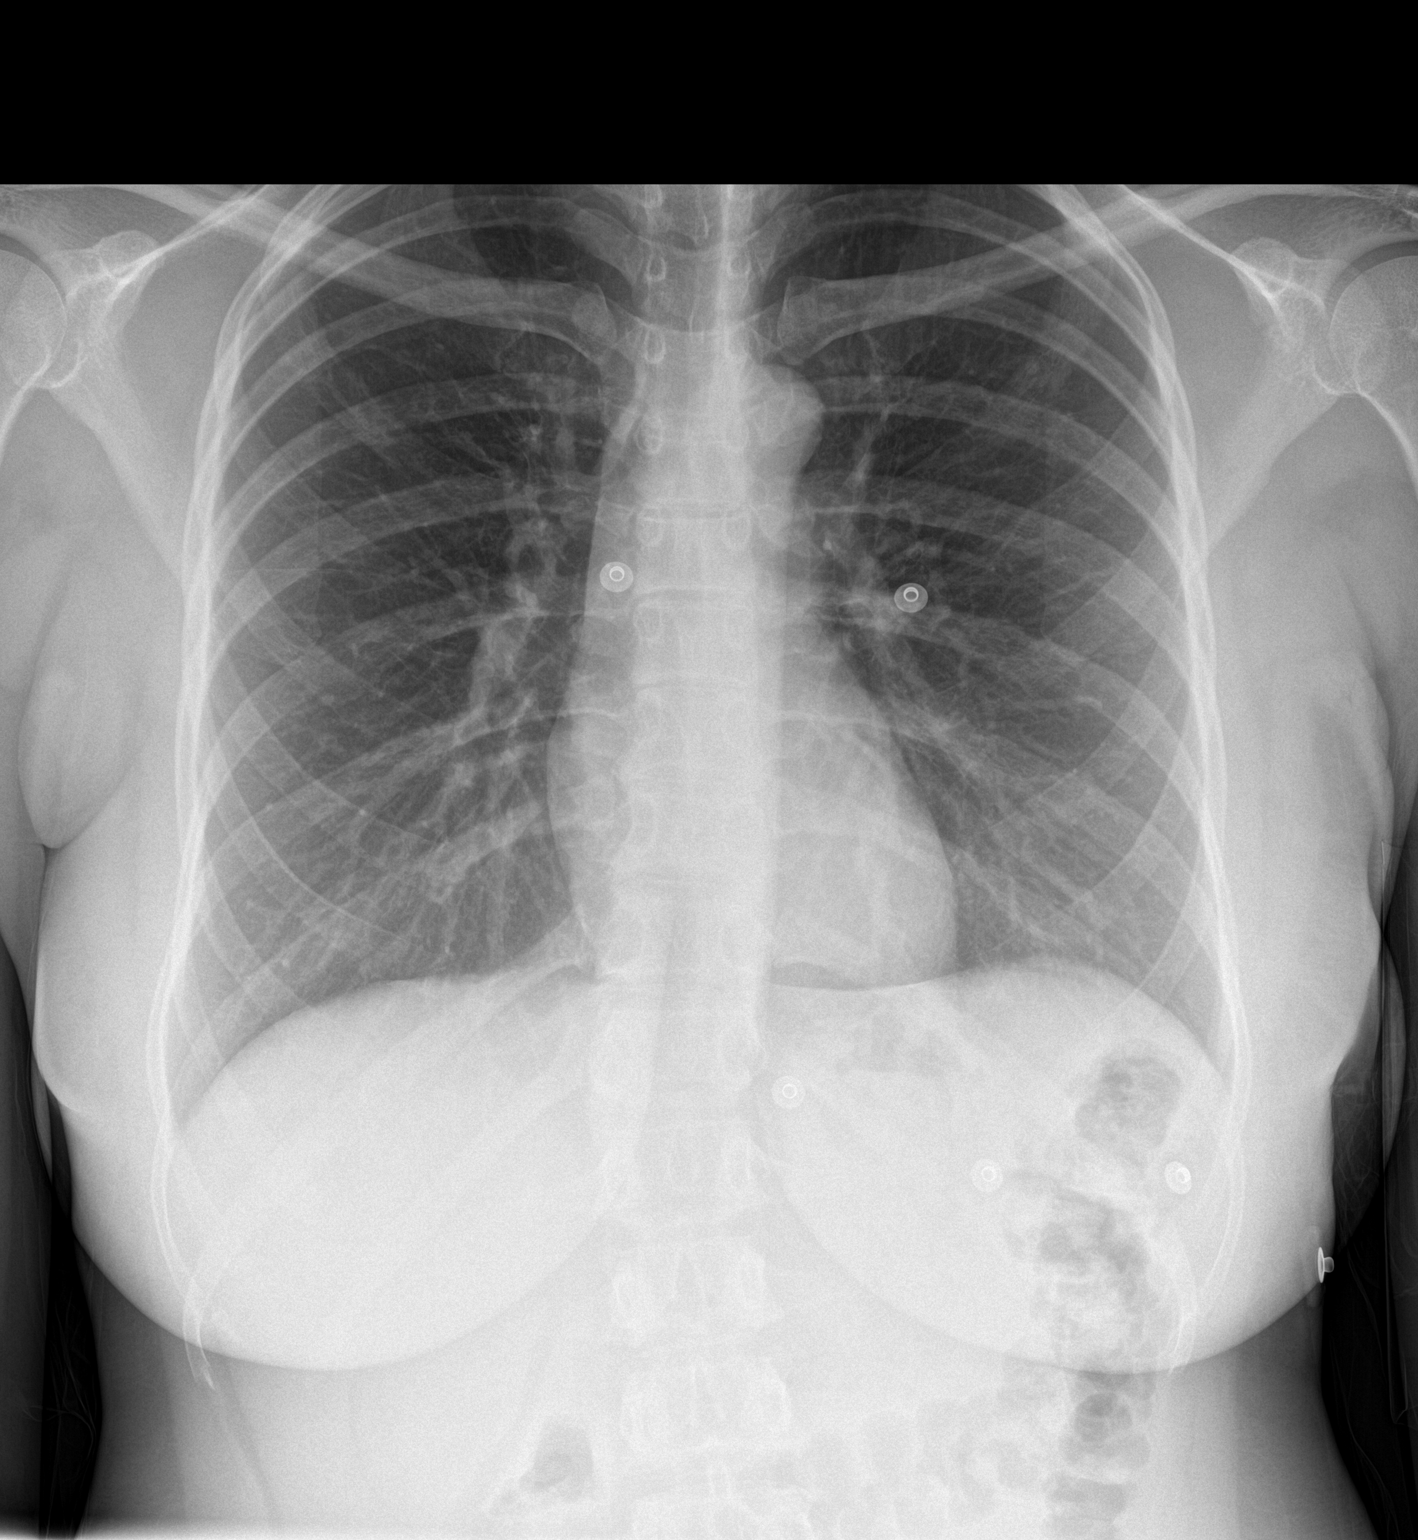

[chest lat]
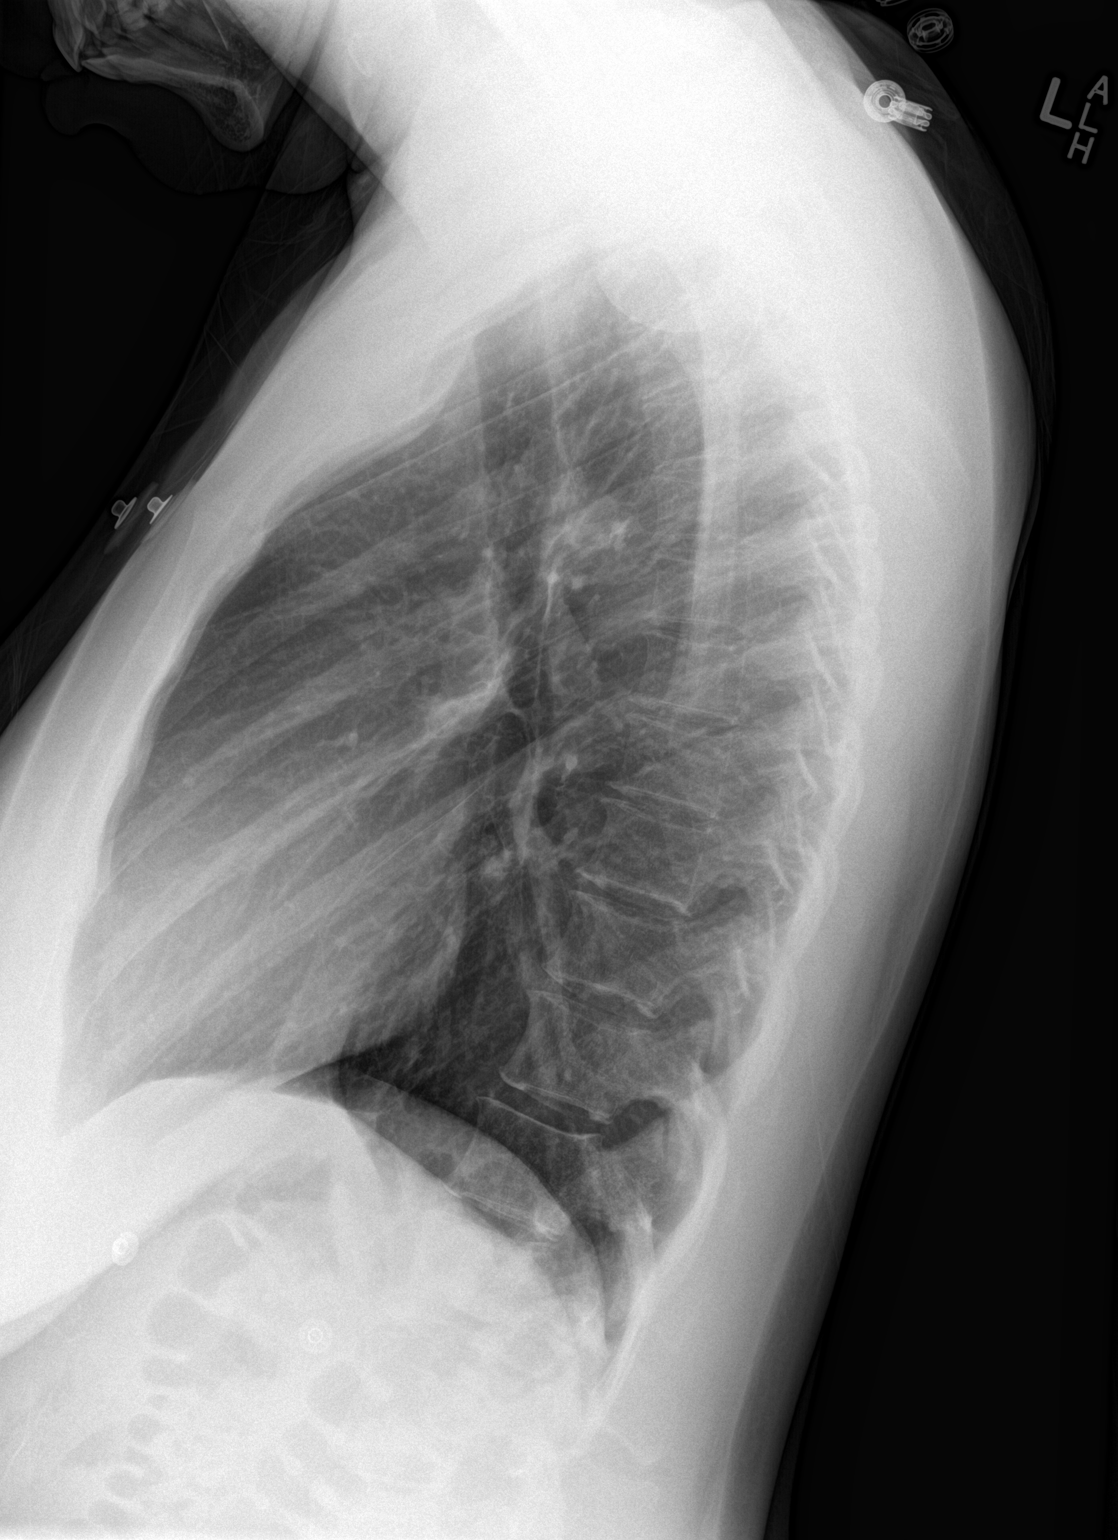

[2 of 2 positions shown; findings below may reference images not displayed]

FINDINGS: Lungs are clear.  No pleural effusion or pneumothorax.

The heart is normal in size.

Visualized osseous structures are within normal limits.
IMPRESSION: Normal chest radiographs.

## 2015-07-15 IMAGING — CR DG CHEST 2V
2 series · 2 of 2 positions shown · non-contrast
Comparison: Chest radiograph performed 08/30/2014

CLINICAL DATA: Acute onset of shortness of breath and bradycardia
for 2 hours. Initial encounter.

EXAM:
CHEST  2 VIEW

[w chest pa]
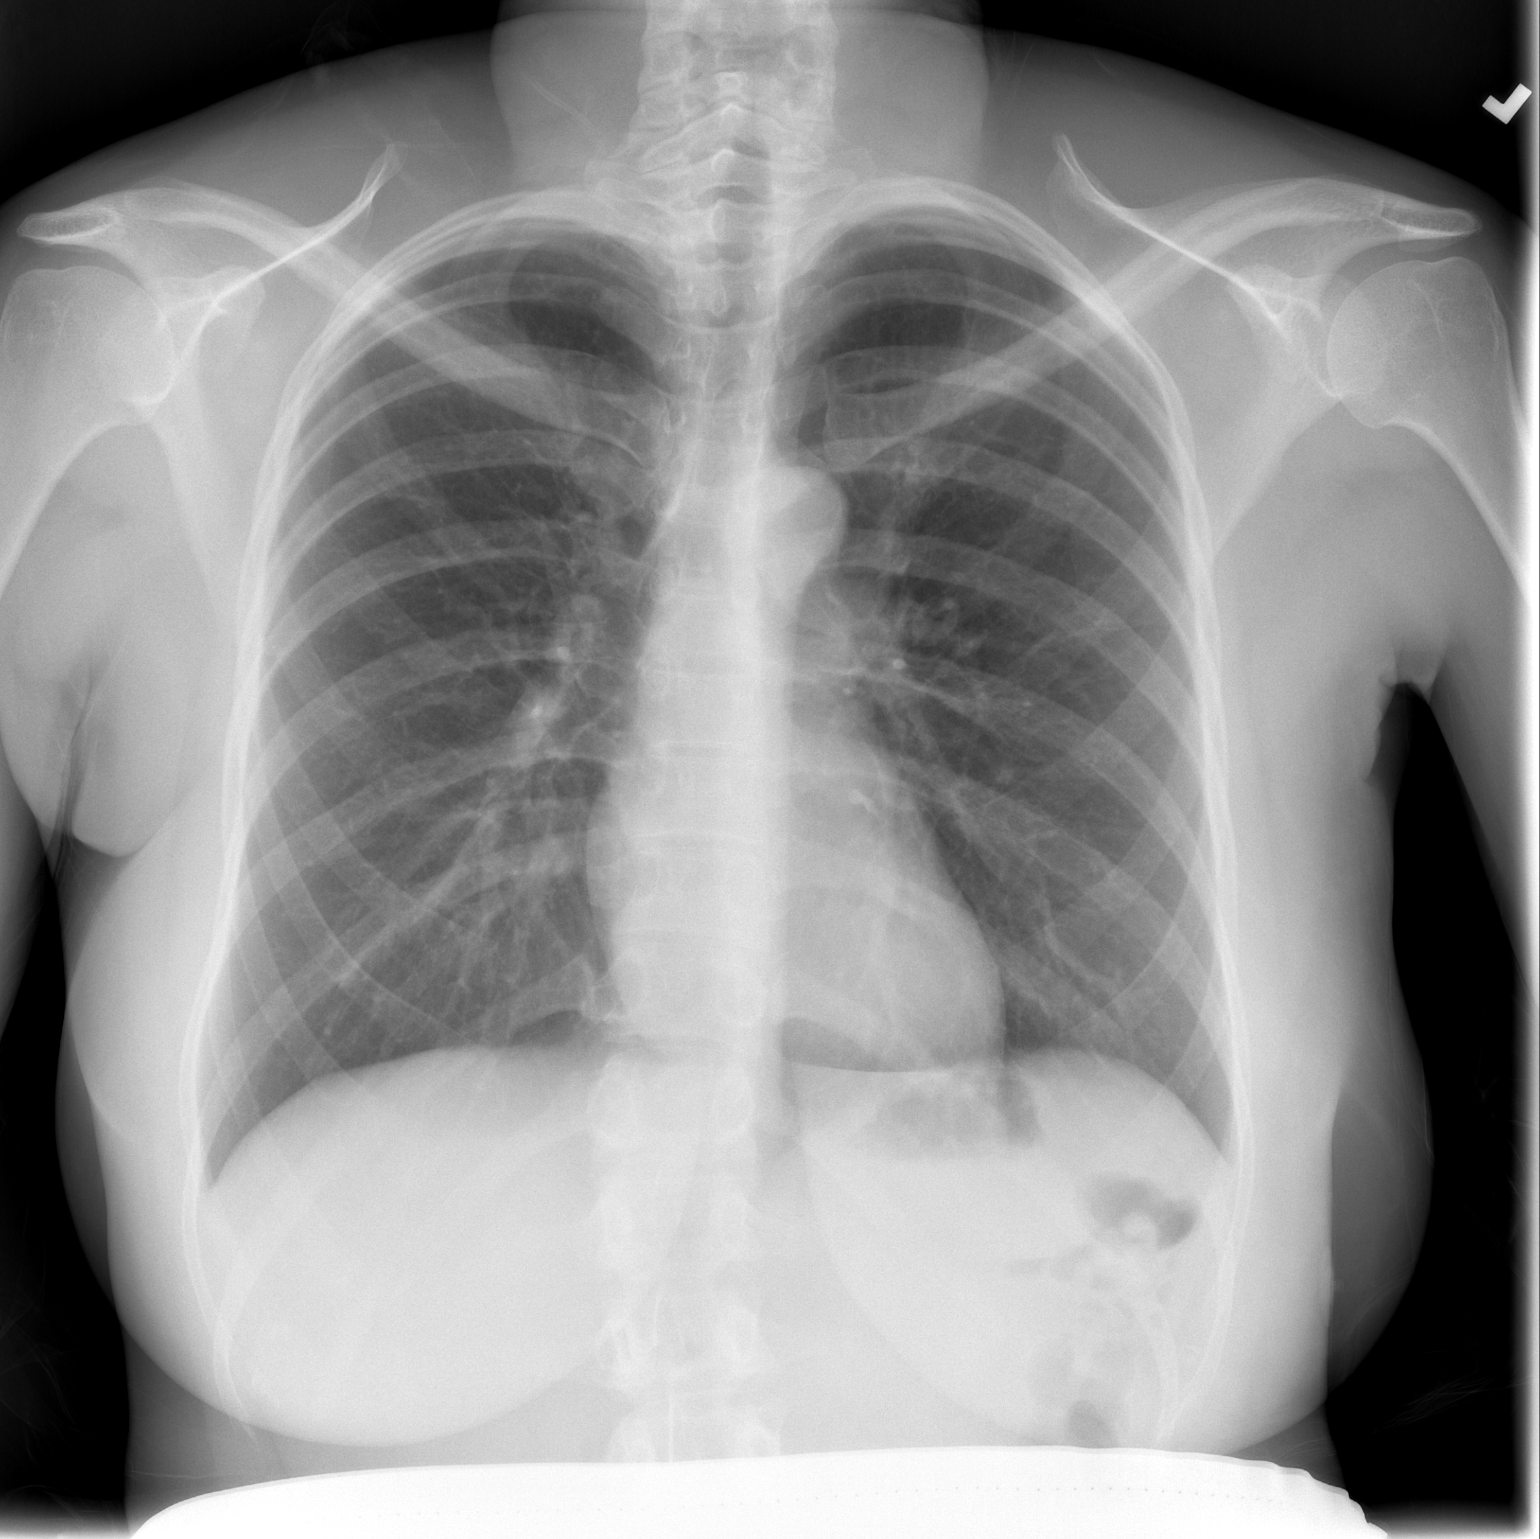

[w chest lat]
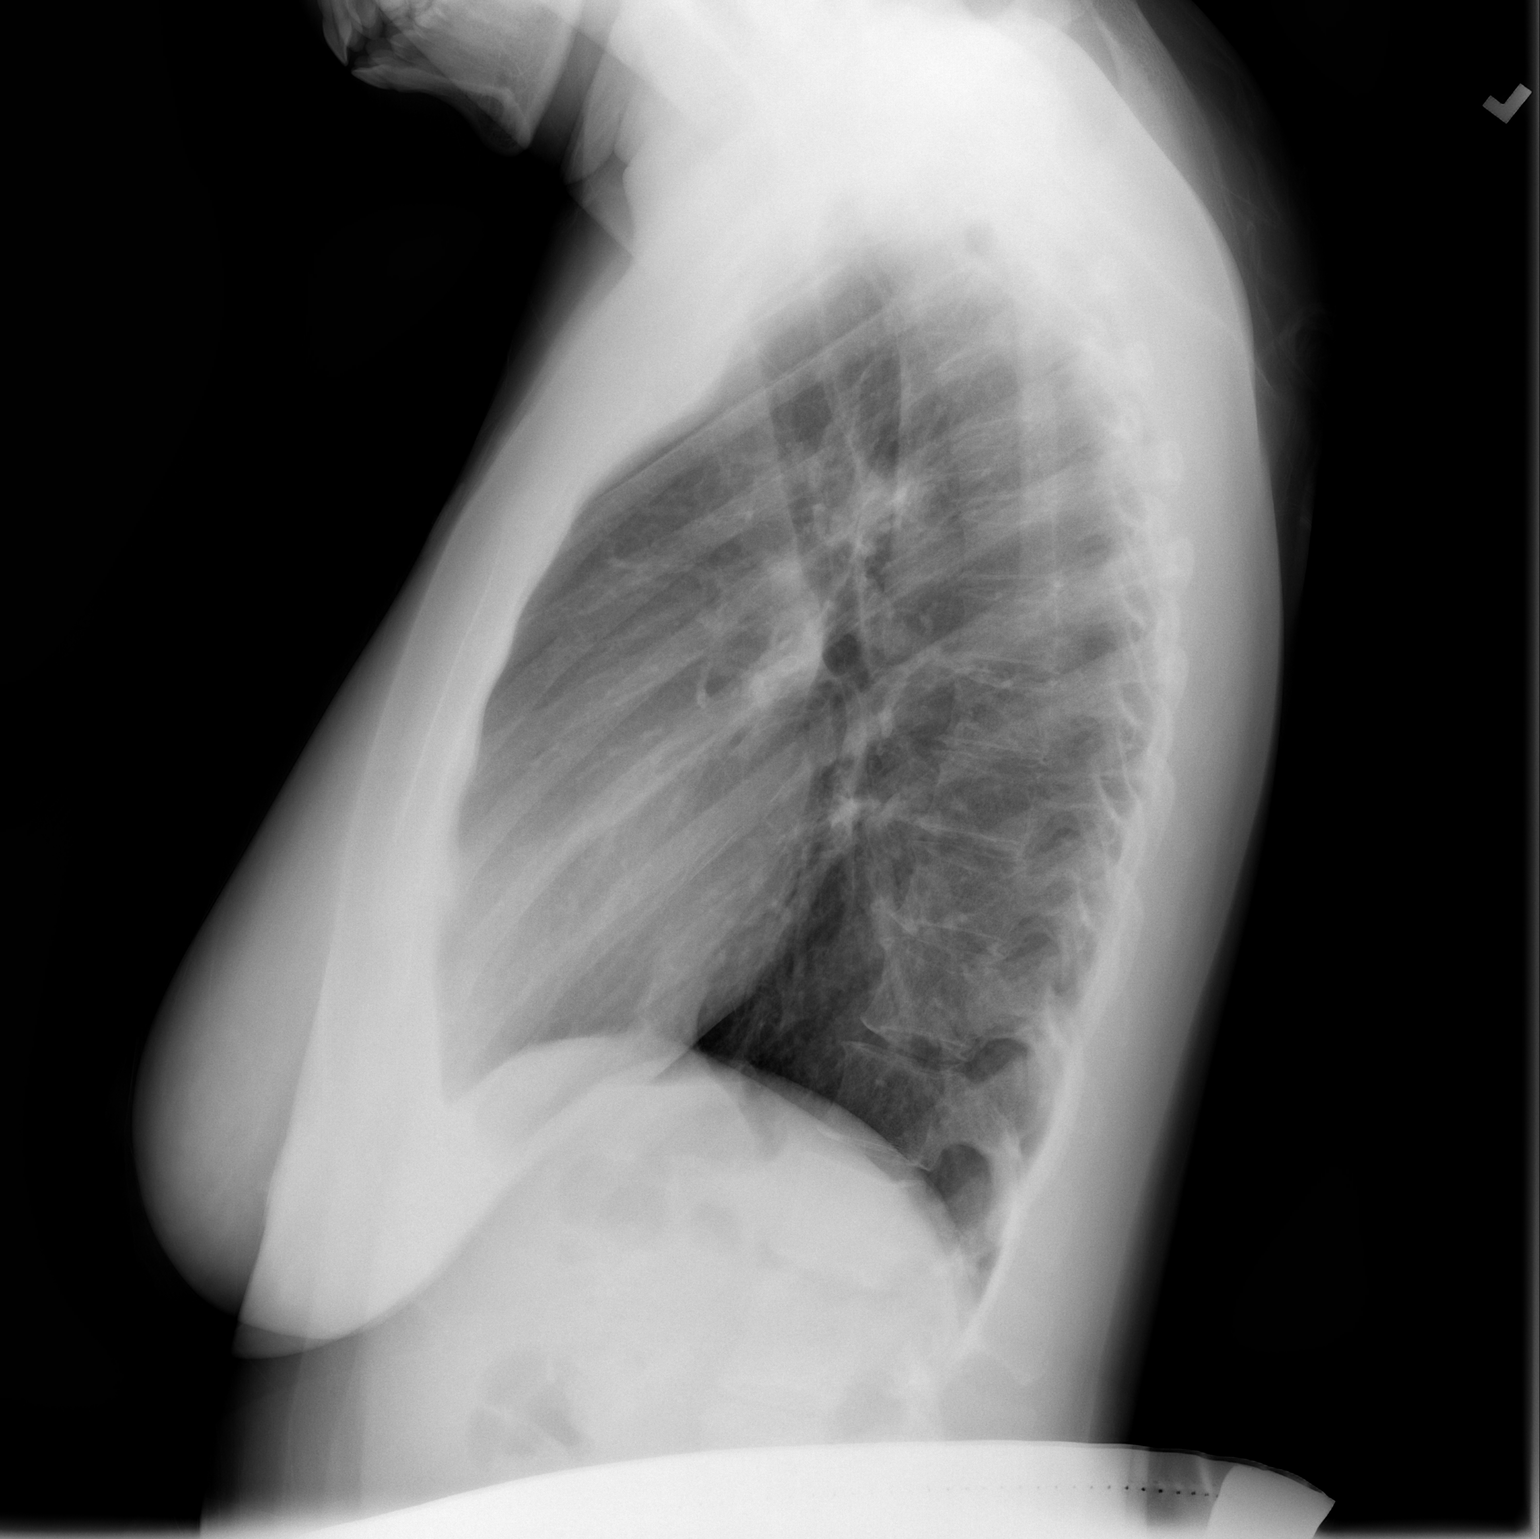

[2 of 2 positions shown; findings below may reference images not displayed]

FINDINGS: The lungs are well-aerated and clear. There is no evidence of focal
opacification, pleural effusion or pneumothorax.

The heart is normal in size; the mediastinal contour is within
normal limits. No acute osseous abnormalities are seen.
IMPRESSION: No acute cardiopulmonary process seen.

## 2015-09-24 ENCOUNTER — Telehealth: Payer: Self-pay | Admitting: Cardiovascular Disease

## 2015-09-24 NOTE — Telephone Encounter (Signed)
New Message  Pt calling to speak w./ RN concerning her diastolic dysfunction.Please call back and discuss.

## 2015-09-24 NOTE — Telephone Encounter (Signed)
She is perfectly safe to have her colonoscopy with this mild diastolic dysfunction I will be glad to talk to her more about it at her visit in several weeks

## 2015-09-24 NOTE — Telephone Encounter (Signed)
Spoke with patient who called to get more information about her diastolic dysfunction (as reported after echo 11/15).  I explained the meaning of diastolic dysfunction and answered her questions to her satisfaction.  She states she needs an endoscopy and wants to make certain it is safe to have that procedure.  I advised her that it is safe to proceed with the endoscopy but she needs follow up; she was advised to return for 3 month follow-up 1 year ago.  She apologized for not making an appointment and is now scheduled to see Dr. Elease HashimotoNahser on 1/27.  She thanked me for the call.

## 2015-10-23 ENCOUNTER — Ambulatory Visit: Payer: BLUE CROSS/BLUE SHIELD | Admitting: Cardiovascular Disease

## 2015-10-28 ENCOUNTER — Ambulatory Visit: Payer: Self-pay | Admitting: Cardiovascular Disease

## 2015-11-04 ENCOUNTER — Ambulatory Visit: Payer: Self-pay | Admitting: Cardiovascular Disease

## 2015-11-26 ENCOUNTER — Ambulatory Visit: Payer: Self-pay | Admitting: Cardiovascular Disease

## 2016-03-24 ENCOUNTER — Encounter: Payer: Self-pay | Admitting: Cardiovascular Disease

## 2016-03-24 ENCOUNTER — Ambulatory Visit (INDEPENDENT_AMBULATORY_CARE_PROVIDER_SITE_OTHER): Payer: Managed Care, Other (non HMO) | Admitting: Cardiovascular Disease

## 2016-03-24 VITALS — BP 108/64 | HR 69 | Ht 69.0 in | Wt 168.1 lb

## 2016-03-24 DIAGNOSIS — I309 Acute pericarditis, unspecified: Secondary | ICD-10-CM | POA: Diagnosis not present

## 2016-03-24 DIAGNOSIS — R000 Tachycardia, unspecified: Secondary | ICD-10-CM | POA: Diagnosis not present

## 2016-03-24 NOTE — Patient Instructions (Signed)
Medication Instructions:  None  Labwork: None  Testing/Procedures: None  Follow-Up: Your physician wants you to follow-up in: 1 year with Dr. Nahser.  You will receive a reminder letter in the mail two months in advance. If you don't receive a letter, please call our office to schedule the follow-up appointment.   Any Other Special Instructions Will Be Listed Below (If Applicable).     If you need a refill on your cardiac medications before your next appointment, please call your pharmacy.   

## 2016-03-24 NOTE — Progress Notes (Signed)
Jennifer Mayer Date of Birth  10-Aug-1986       [facility]    [place] [facility] [address]  [address], [address] [place], [place]  [address]   [place], [place]  [address] [phone]     [phone]   Fax  [phone]     Fax [phone]  Problem List: 1. 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   She was originally diagnosed with strep throat. She has had chest discomfort off and on since that time.  Occasionally takes Motrin with relief.  She saw De. Nodi on Sept.   She tried Celexa and valium but stopped  ECG at work - ? Possible pericarditis + pleuretic chest pain, worse with lying back, better with sitting up   No fevers or chills,  No arthritis symptoms No bleeding in her stool  She went to the ER on 1 occasion for CP and NS ST changes  D-dimer was +, CT angio was negative for PE. She was started on Prednisone. Still have some soreness - although its much better.  Was on prednisone for a while, now has tapered off.   Sharp , stabbing CP,  Typically in the middle of her chest  Worse with lying down, better with sitting up  Nonsmoker Occasional ETOH Fhx- mother and father have HTN  Dec. 17, 2015: Jennifer Mayer is a 30 yo with pleuretic CP assumed to be pericarditis.  She was seen by Lorin PicketScott several days ago. Still having some CP.  A stress myvoiew was done and is normal.  The pain has improved quite a bit. Seems to be worse with movement.   Still on colchicine 0.6 BID and motrin 400-600 TID.   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  She's doing very well. She's not had any recurrent episodes of pain. She did have some GI issues earlier this year.  No current outpatient prescriptions on file prior to visit.   No current facility-administered medications on file prior to visit.    Allergies    Allergen Reactions  . Sulfa Antibiotics Other (See Comments)    Leg pain  . Flagyl [Metronidazole Hcl] Hives  . Morphine And Related Itching    Severe    Past Medical History  Diagnosis Date  . Headache(784.0)   . Depression   . Abnormal Pap smear   . Thyroid nodule   . Hx of varicella   . Hx of echocardiogram     Echo (12/15):  EF 60-65%, no RWMA, no effusion  . Hx of cardiovascular stress test     Lexiscan Myoview (12/15):  EF 75%, breast atten, Low Risk    Past Surgical History  Procedure Laterality Date  . Wisdom tooth extraction    . Leep    . Gynecologic cryosurgery      History  Smoking status  . Never Smoker   Smokeless tobacco  . Never Used    History  Alcohol Use No    Family History  Problem Relation Age of Onset  . Other Neg Hx   . Hypertension Mother   . Hypertension Father   . Heart attack Maternal Grandfather   . Stroke Neg Hx     Reviw of Systems:  Reviewed in the HPI.  All other  systems are negative.  Physical Exam: Blood pressure 108/64, pulse 69, height 5\' 9"  (1.753 m), weight 168 lb 1.9 oz (76.259 kg), unknown if currently breastfeeding. Wt Readings from Last 3 Encounters:  03/24/16 168 lb 1.9 oz (76.259 kg)  10/06/14 177 lb (80.287 kg)  09/18/14 174 lb (78.926 kg)     General: Well developed, well nourished, in no acute distress.  Head: Normocephalic, atraumatic, sclera non-icteric, mucus membranes are moist,   Neck: Supple. Carotids are 2 + without bruits. No JVD   Lungs: Clear   Heart: RR, normal s1, loud s2, no murmur, no rub.  Abdomen: Soft, non-tender, non-distended with normal bowel sounds.  Msk:  Strength and tone are normal   Extremities: No clubbing or cyanosis. No edema.  Distal pedal pulses are 2+ and equal    Neuro: CN II - XII intact.  Alert and oriented X 3.  Psych:  Normal   ECG: NSR at 69.   Sinus arrhythmia. Normal ECG  Assessment / Plan:   1. Pericarditis:    Seems to be doing great .  no  recurrent symptoms  2. Mild diastolic dysfunction: Was found in the setting of pericarditis She does not have any signs or symptoms of diastolic dysfunction at present. I don't think that we necessarily need repeat the echo and she is certain he does not need any specific treatment. Her blood pressure is normal. Her weight is good. We will continue to follow her and I will see her again in one year.  3. Sinus tach:  Has resolved .      Kristeen MissPhilip Bary Limbach, MD  03/24/2016 11:27 AM    [facility] [address],  [address] [place], [place]  [address] Pager [phone] Phone: [phone]; Fax: [phone]

## 2016-03-30 ENCOUNTER — Ambulatory Visit: Payer: Self-pay | Admitting: Cardiovascular Disease

## 2016-03-30 NOTE — Addendum Note (Signed)
Addended by: Reesa ChewJONES, Lynell Kussman G on: 03/30/2016 04:40 PM   Modules accepted: Orders

## 2016-05-24 ENCOUNTER — Emergency Department (HOSPITAL_COMMUNITY)
Admission: EM | Admit: 2016-05-24 | Discharge: 2016-05-24 | Disposition: A | Discharge status: Home / Self Care | Payer: Managed Care, Other (non HMO) | Attending: Emergency Medicine | Admitting: Emergency Medicine

## 2016-05-24 ENCOUNTER — Encounter (HOSPITAL_COMMUNITY): Payer: Self-pay

## 2016-05-24 ENCOUNTER — Emergency Department (HOSPITAL_COMMUNITY): Payer: Managed Care, Other (non HMO)

## 2016-05-24 DIAGNOSIS — R51 Headache: Secondary | ICD-10-CM | POA: Insufficient documentation

## 2016-05-24 DIAGNOSIS — R0789 Other chest pain: Secondary | ICD-10-CM | POA: Insufficient documentation

## 2016-05-24 DIAGNOSIS — R531 Weakness: Secondary | ICD-10-CM

## 2016-05-24 DIAGNOSIS — E876 Hypokalemia: Secondary | ICD-10-CM | POA: Diagnosis not present

## 2016-05-24 DIAGNOSIS — R079 Chest pain, unspecified: Secondary | ICD-10-CM | POA: Diagnosis present

## 2016-05-24 LAB — DIFFERENTIAL
BASOS ABS: 0 10*3/uL (ref 0.0–0.1)
BASOS PCT: 0 %
Eosinophils Absolute: 0.1 10*3/uL (ref 0.0–0.7)
Eosinophils Relative: 1 %
LYMPHS ABS: 4.9 10*3/uL — AB (ref 0.7–4.0)
Lymphocytes Relative: 53 %
MONO ABS: 0.7 10*3/uL (ref 0.1–1.0)
MONOS PCT: 8 %
NEUTROS ABS: 3.5 10*3/uL (ref 1.7–7.7)
Neutrophils Relative %: 38 %

## 2016-05-24 LAB — I-STAT CHEM 8, ED
BUN: 18 mg/dL (ref 6–20)
CALCIUM ION: 1.05 mmol/L — AB (ref 1.15–1.40)
CREATININE: 1 mg/dL (ref 0.44–1.00)
Chloride: 107 mmol/L (ref 101–111)
GLUCOSE: 129 mg/dL — AB (ref 65–99)
HCT: 40 % (ref 36.0–46.0)
HEMOGLOBIN: 13.6 g/dL (ref 12.0–15.0)
POTASSIUM: 3.7 mmol/L (ref 3.5–5.1)
Sodium: 139 mmol/L (ref 135–145)
TCO2: 20 mmol/L (ref 0–100)

## 2016-05-24 LAB — COMPREHENSIVE METABOLIC PANEL
ALK PHOS: 39 U/L (ref 38–126)
ALT: 15 U/L (ref 14–54)
AST: 22 U/L (ref 15–41)
Albumin: 4.4 g/dL (ref 3.5–5.0)
Anion gap: 8 (ref 5–15)
BILIRUBIN TOTAL: 1.4 mg/dL — AB (ref 0.3–1.2)
BUN: 14 mg/dL (ref 6–20)
CALCIUM: 9.4 mg/dL (ref 8.9–10.3)
CHLORIDE: 108 mmol/L (ref 101–111)
CO2: 19 mmol/L — ABNORMAL LOW (ref 22–32)
CREATININE: 1.03 mg/dL — AB (ref 0.44–1.00)
Glucose, Bld: 134 mg/dL — ABNORMAL HIGH (ref 65–99)
Potassium: 3 mmol/L — ABNORMAL LOW (ref 3.5–5.1)
Sodium: 135 mmol/L (ref 135–145)
TOTAL PROTEIN: 7.1 g/dL (ref 6.5–8.1)

## 2016-05-24 LAB — CBC
HEMATOCRIT: 38.4 % (ref 36.0–46.0)
HEMOGLOBIN: 12.4 g/dL (ref 12.0–15.0)
MCH: 29.2 pg (ref 26.0–34.0)
MCHC: 32.3 g/dL (ref 30.0–36.0)
MCV: 90.4 fL (ref 78.0–100.0)
Platelets: 247 10*3/uL (ref 150–400)
RBC: 4.25 MIL/uL (ref 3.87–5.11)
RDW: 12.9 % (ref 11.5–15.5)
WBC: 9.3 10*3/uL (ref 4.0–10.5)

## 2016-05-24 LAB — D-DIMER, QUANTITATIVE (NOT AT ARMC): D DIMER QUANT: 0.4 ug{FEU}/mL (ref 0.00–0.50)

## 2016-05-24 LAB — I-STAT BETA HCG BLOOD, ED (MC, WL, AP ONLY): I-stat hCG, quantitative: <5 m[IU]/mL (ref <5)

## 2016-05-24 LAB — PROTIME-INR
INR: 1.04
Prothrombin Time: 13.6 seconds (ref 11.4–15.2)

## 2016-05-24 LAB — I-STAT TROPONIN, ED: TROPONIN I, POC: 0 ng/mL (ref 0.00–0.08)

## 2016-05-24 LAB — CBG MONITORING, ED: GLUCOSE-CAPILLARY: 106 mg/dL — AB (ref 65–99)

## 2016-05-24 LAB — APTT: aPTT: 34 seconds (ref 24–36)

## 2016-05-24 MED ORDER — POTASSIUM CHLORIDE CRYS ER 20 MEQ PO TBCR: 40.0000 meq | EXTENDED_RELEASE_TABLET | Freq: Every day | ORAL | 0 refills | Status: DC

## 2016-05-24 MED ORDER — LORAZEPAM 2 MG/ML IJ SOLN
1.0000 mg | Freq: Once | INTRAMUSCULAR | Status: AC
  Administered 2016-05-24: 1 mg via INTRAVENOUS
  Filled 2016-05-24: qty 1

## 2016-05-24 MED ORDER — POTASSIUM CHLORIDE CRYS ER 20 MEQ PO TBCR
40.0000 meq | EXTENDED_RELEASE_TABLET | Freq: Once | ORAL | Status: AC
  Administered 2016-05-24: 40 meq via ORAL
  Filled 2016-05-24: qty 2

## 2016-05-24 MED ORDER — SODIUM CHLORIDE 0.9 % IV BOLUS (SEPSIS)
1000.0000 mL | Freq: Once | INTRAVENOUS | Status: AC
  Administered 2016-05-24: 1000 mL via INTRAVENOUS

## 2016-05-24 MED ORDER — POTASSIUM CHLORIDE 10 MEQ/100ML IV SOLN
10.0000 meq | Freq: Once | INTRAVENOUS | Status: AC
  Administered 2016-05-24: 10 meq via INTRAVENOUS
  Filled 2016-05-24: qty 100

## 2016-05-24 NOTE — ED Notes (Signed)
Ambulated pt around the pod without difficulty.

## 2016-05-24 NOTE — ED Notes (Signed)
Pt taken to CT.

## 2016-05-24 NOTE — ED Notes (Signed)
  Spoke to lab, will add d-dimer

## 2016-05-24 NOTE — ED Triage Notes (Signed)
Pt complaining of L sided chest pain and nausea. Pt states seen by UC today, states waiting on results of a d-dimer test.

## 2016-05-24 NOTE — ED Notes (Signed)
Pt departed in NAD.  

## 2016-05-24 NOTE — ED Notes (Signed)
Pt complaining of L sided weakness/numbness. Pt with some decreased sensation on L. Onset 2200 05/23/16. MD at bedside.

## 2016-05-24 NOTE — ED Provider Notes (Signed)
[facility]EM[facility]Y DEPT Provider Note   CSN: [id number] Arrival date & time: 05/24/16  0001  By signing my name below, I, Jennifer Mayer, attest that this documentation has been prepared under the direction and in the presence of Shon Baton, MD.  Electronically Signed: Octavia Heir, ED Scribe. 05/24/16. 1:37 AM.    History   Chief Complaint Chief Complaint  Patient presents with  . Chest Pain   The history is provided by the patient. No language interpreter was used.   HPI Comments: Jennifer Mayer is a 30 y.o. female who has a PMhx of depression, chest pain, stress test, and thyroid nodule presents to the Emergency Department complaining of sudden onset, gradual worsening, moderate, intermittent, left sided chest pain onset today. She notes associated palpitations, left sided weakness, and left arm numbness. Pt says she just doesn't "feel well". She says she was seen by UC for right calf pain/leg swelling three days ago and had a d-dimer done to rule out DVT but she is still waiting on the results. Pt says she had been traveling ~ 3-4 hours at a time frequently and notes her current symptoms started shortly after. Pt notes she took some ibuprofen to alleviate her pain with no relief. Pt does not have any menstrual periods currently. She denies fever, HA, neck pain, visual changes, vomiting, or shortness of breath.  Past Medical History:  Diagnosis Date  . Abnormal Pap smear   . Depression   . Headache(784.0)   . Hx of cardiovascular stress test    Lexiscan Myoview (12/15):  EF 75%, breast atten, Low Risk  . Hx of echocardiogram    Echo (12/15):  EF 60-65%, no RWMA, no effusion  . Hx of varicella   . Thyroid nodule     Patient Active Problem List   Diagnosis Date Noted  . Shortness of breath 10/06/2014  . Chest pain 10/06/2014  . Sinus tachycardia (HCC) 09/11/2014  . Acute pericarditis 08/11/2014    Past Surgical History:  Procedure Laterality Date  . GYNECOLOGIC  CRYOSURGERY    . LEEP    . WISDOM TOOTH EXTRACTION      OB History    Gravida Para Term Preterm AB Living   2 1 1  0 1 1   SAB TAB Ectopic Multiple Live Births   1 0 0 0 1       Home Medications    Prior to Admission medications   Medication Sig Start Date End Date Taking? Authorizing Provider  ibuprofen (ADVIL,MOTRIN) 200 MG tablet Take 200 mg by mouth every 6 (six) hours as needed for moderate pain.   Yes Historical Provider, MD  potassium chloride SA (K-DUR,KLOR-CON) 20 MEQ tablet Take 2 tablets (40 mEq total) by mouth daily. 05/24/16   Shon Baton, MD    Family History Family History  Problem Relation Age of Onset  . Hypertension Mother   . Hypertension Father   . Heart attack Maternal Grandfather   . Other Neg Hx   . Stroke Neg Hx     Social History Social History  Substance Use Topics  . Smoking status: Never Smoker  . Smokeless tobacco: Never Used  . Alcohol use No     Allergies   Sulfa antibiotics; Flagyl [metronidazole hcl]; and Morphine and related   Review of Systems Review of Systems  Constitutional: Negative for fever.  Eyes: Negative for visual disturbance.  Respiratory: Negative for shortness of breath.   Cardiovascular: Positive for chest pain, palpitations and  leg swelling.  Gastrointestinal: Positive for nausea. Negative for abdominal pain and vomiting.  Musculoskeletal: Negative for neck pain.  Neurological: Positive for weakness. Negative for headaches.  All other systems reviewed and are negative.    Physical Exam Updated Vital Signs BP 107/76   Pulse 77   Temp 97.7 F (36.5 C) (Oral)   Resp 15   Ht 5\' 8"  (1.727 m)   Wt 168 lb (76.2 kg)   SpO2 100%   BMI 25.54 kg/m   Physical Exam  Constitutional: She is oriented to person, place, and time. She appears well-developed and well-nourished.  Anxious appearing  HENT:  Head: Normocephalic and atraumatic.  Eyes: Pupils are equal, round, and reactive to light.    Cardiovascular: Normal rate, regular rhythm and normal heart sounds.   Pulmonary/Chest: Effort normal and breath sounds normal. No respiratory distress. She has no wheezes.  Abdominal: Soft. Bowel sounds are normal. There is no tenderness. There is no guarding.  Musculoskeletal: She exhibits no edema.  No calf tenderness noted  Neurological: She is alert and oriented to person, place, and time.  Cranial nerves II through XII intact, 5 out of 5 strength in all 4 extremities, no dysmetria to finger-nose-finger, normal gait  Skin: Skin is warm and dry.  Psychiatric:  Anxious  Nursing note and vitals reviewed.    ED Treatments / Results  DIAGNOSTIC STUDIES: Oxygen Saturation is 100% on RA, normal by my interpretation.  COORDINATION OF CARE:  1:31 AM Discussed treatment plan with pt at bedside and pt agreed to plan.  Labs (all labs ordered are listed, but only abnormal results are displayed) Labs Reviewed  DIFFERENTIAL - Abnormal; Notable for the following:       Result Value   Lymphs Abs 4.9 (*)    All other components within normal limits  COMPREHENSIVE METABOLIC PANEL - Abnormal; Notable for the following:    Potassium 3.0 (*)    CO2 19 (*)    Glucose, Bld 134 (*)    Creatinine, Ser 1.03 (*)    Total Bilirubin 1.4 (*)    All other components within normal limits  CBG MONITORING, ED - Abnormal; Notable for the following:    Glucose-Capillary 106 (*)    All other components within normal limits  I-STAT CHEM 8, ED - Abnormal; Notable for the following:    Glucose, Bld 129 (*)    Calcium, Ion 1.05 (*)    All other components within normal limits  PROTIME-INR  APTT  CBC  D-DIMER, QUANTITATIVE (NOT AT [facility])  I-STAT TROPOININ, ED  CBG MONITORING, ED  I-STAT BETA HCG BLOOD, ED ([facility], [facility], [facility] ONLY)    EKG  EKG Interpretation  Date/Time:  Tuesday May 24 2016 00:03:14 EDT Ventricular Rate:  121 PR Interval:    QRS Duration: 84 QT Interval:  436 QTC Calculation: 619 R  Axis:   81 Text Interpretation:   Critical Test Result: Long QTc Sinus tachycardia Nonspecific ST abnormality Abnormal ECG Has had long QT in past Confirmed by HORTON  MD, COURTNEY ([id number]) on 05/24/2016 4:49:58 AM       EKG Interpretation  Date/Time:  Tuesday May 24 2016 00:03:14 EDT Ventricular Rate:  84 PR Interval:    QRS Duration: 92 QT Interval:  365 QTC Calculation: 432 R Axis:   62 Text Interpretation:  Sinus rhythm Borderline T abnormalities, anterior leads QT improved Confirmed by Wilkie Aye  MD, Toni Amend ([id number]) on 05/24/2016 5:21:12 AM        Radiology  Ct Head Wo Contrast  Result Date: 05/24/2016 CLINICAL DATA:  Initial evaluation for acute left-sided weakness. EXAM: CT HEAD WITHOUT CONTRAST TECHNIQUE: Contiguous axial images were obtained from the base of the skull through the vertex without intravenous contrast. COMPARISON:  None. FINDINGS: There is no acute intracranial hemorrhage or infarct. No mass lesion or midline shift. Gray-white matter differentiation is well maintained. Ventricles are normal in size without evidence of hydrocephalus. CSF containing spaces are within normal limits. No extra-axial fluid collection. The calvarium is intact. Orbital soft tissues are within normal limits. The paranasal sinuses and mastoid air cells are well pneumatized and free of fluid. Scalp soft tissues are unremarkable. IMPRESSION: Negative head CT.  No acute intracranial process identified. Electronically Signed   By: Rise MuBenjamin  McClintock M.D.   On: 05/24/2016 01:26    Procedures Procedures (including critical care time)  Medications Ordered in ED Medications  LORazepam (ATIVAN) injection 1 mg (1 mg Intravenous Given 05/24/16 0153)  sodium chloride 0.9 % bolus 1,000 mL (0 mLs Intravenous Stopped 05/24/16 0256)  potassium chloride 10 mEq in 100 mL IVPB (0 mEq Intravenous Stopped 05/24/16 0510)  potassium chloride SA (K-DUR,KLOR-CON) CR tablet 40 mEq (40 mEq Oral Given 05/24/16 0512)      Initial Impression / Assessment and Plan / ED Course  I have reviewed the triage vital signs and the nursing notes.  Pertinent labs & imaging results that were available during my care of the patient were reviewed by me and considered in my medical decision making (see chart for details).  Clinical Course    Patient presents with chest pain, weakness left greater than right, and palpitations. She has a history of chest pain and has been worked up by cardiology and gastroenterology. Her EKG and chest pain workup is reassuring including d-dimer. She was notably hypokalemic with potassium of 3.0. No objective weakness on exam. CT head negative. She does report occasional headaches but no headaches at this time. She appears very anxious on my initial evaluation. She was given Ativan and fluids. On recheck, she states that she feels better. She has been given potassium. She is able to ambulate independently and her neurologic exam is reassuring.  We will place on 5 days of potassium supplementation. Follow-up with primary physician and cardiology.  After history, exam, and medical workup I feel the patient has been appropriately medically screened and is safe for discharge home. Pertinent diagnoses were discussed with the patient. Patient was given return precautions.   Final Clinical Impressions(s) / ED Diagnoses   Final diagnoses:  Hypokalemia  Generalized weakness  Other chest pain   I personally performed the services described in this documentation, which was scribed in my presence. The recorded information has been reviewed and is accurate.  New Prescriptions New Prescriptions   POTASSIUM CHLORIDE SA (K-DUR,KLOR-CON) 20 MEQ TABLET    Take 2 tablets (40 mEq total) by mouth daily.     Shon Batonourtney F Horton, MD 05/24/16 [phone]

## 2016-05-24 NOTE — Discharge Instructions (Signed)
You were seen today for weakness, chest pain. Your workup is notable for hypokalemia or low potassium. You will be given a 5 day supply of potassium supplementation. Your d-dimer was negative and the rest of your workup is reassuring. Follow-up with her primary physician and cardiologist.

## 2016-07-15 ENCOUNTER — Other Ambulatory Visit: Payer: Self-pay | Admitting: *Deleted

## 2016-07-15 DIAGNOSIS — N644 Mastodynia: Secondary | ICD-10-CM

## 2016-07-15 DIAGNOSIS — R590 Localized enlarged lymph nodes: Secondary | ICD-10-CM

## 2016-07-21 ENCOUNTER — Ambulatory Visit
Admission: RE | Admit: 2016-07-21 | Discharge: 2016-07-21 | Disposition: A | Discharge status: Home / Self Care | Payer: Managed Care, Other (non HMO) | Source: Ambulatory Visit | Attending: *Deleted | Admitting: *Deleted

## 2016-07-21 DIAGNOSIS — N644 Mastodynia: Secondary | ICD-10-CM

## 2016-07-21 DIAGNOSIS — R590 Localized enlarged lymph nodes: Secondary | ICD-10-CM

## 2016-08-31 ENCOUNTER — Other Ambulatory Visit: Payer: Self-pay | Admitting: *Deleted

## 2016-08-31 DIAGNOSIS — E041 Nontoxic single thyroid nodule: Secondary | ICD-10-CM

## 2016-09-20 ENCOUNTER — Ambulatory Visit
Admission: RE | Admit: 2016-09-20 | Discharge: 2016-09-20 | Disposition: A | Discharge status: Home / Self Care | Payer: Managed Care, Other (non HMO) | Source: Ambulatory Visit | Attending: *Deleted | Admitting: *Deleted

## 2016-09-20 DIAGNOSIS — E041 Nontoxic single thyroid nodule: Secondary | ICD-10-CM

## 2017-01-10 ENCOUNTER — Other Ambulatory Visit: Payer: Self-pay | Admitting: Physician Assistant

## 2017-01-10 DIAGNOSIS — N6489 Other specified disorders of breast: Secondary | ICD-10-CM

## 2017-01-30 ENCOUNTER — Other Ambulatory Visit: Payer: Self-pay | Admitting: Physician Assistant

## 2017-01-30 ENCOUNTER — Ambulatory Visit
Admission: RE | Admit: 2017-01-30 | Discharge: 2017-01-30 | Disposition: A | Discharge status: Home / Self Care | Payer: Managed Care, Other (non HMO) | Source: Ambulatory Visit | Attending: Physician Assistant | Admitting: Physician Assistant

## 2017-01-30 DIAGNOSIS — N6489 Other specified disorders of breast: Secondary | ICD-10-CM

## 2017-04-10 ENCOUNTER — Encounter (HOSPITAL_COMMUNITY): Payer: Self-pay

## 2017-04-10 ENCOUNTER — Inpatient Hospital Stay (HOSPITAL_COMMUNITY)
Admission: AD | Admit: 2017-04-10 | Discharge: 2017-04-10 | Disposition: A | Discharge status: Home / Self Care | Payer: Managed Care, Other (non HMO) | Source: Ambulatory Visit | Attending: Obstetrics & Gynecology | Admitting: Obstetrics & Gynecology

## 2017-04-10 DIAGNOSIS — O99012 Anemia complicating pregnancy, second trimester: Secondary | ICD-10-CM | POA: Diagnosis not present

## 2017-04-10 DIAGNOSIS — O26892 Other specified pregnancy related conditions, second trimester: Secondary | ICD-10-CM | POA: Insufficient documentation

## 2017-04-10 DIAGNOSIS — Z3A17 17 weeks gestation of pregnancy: Secondary | ICD-10-CM | POA: Diagnosis not present

## 2017-04-10 DIAGNOSIS — R110 Nausea: Secondary | ICD-10-CM | POA: Insufficient documentation

## 2017-04-10 DIAGNOSIS — D649 Anemia, unspecified: Secondary | ICD-10-CM | POA: Diagnosis not present

## 2017-04-10 DIAGNOSIS — R51 Headache: Secondary | ICD-10-CM | POA: Diagnosis not present

## 2017-04-10 DIAGNOSIS — R42 Dizziness and giddiness: Secondary | ICD-10-CM | POA: Diagnosis present

## 2017-04-10 LAB — URINALYSIS, ROUTINE W REFLEX MICROSCOPIC
Bilirubin Urine: NEGATIVE
Glucose, UA: NEGATIVE mg/dL
Hgb urine dipstick: NEGATIVE
Ketones, ur: NEGATIVE mg/dL
LEUKOCYTES UA: NEGATIVE
NITRITE: NEGATIVE
PH: 7 (ref 5.0–8.0)
Protein, ur: NEGATIVE mg/dL
SPECIFIC GRAVITY, URINE: 1.004 — AB (ref 1.005–1.030)

## 2017-04-10 LAB — CBC
HCT: 30.9 % — ABNORMAL LOW (ref 36.0–46.0)
HEMOGLOBIN: 10.3 g/dL — AB (ref 12.0–15.0)
MCH: 29.3 pg (ref 26.0–34.0)
MCHC: 33.3 g/dL (ref 30.0–36.0)
MCV: 88 fL (ref 78.0–100.0)
Platelets: 219 10*3/uL (ref 150–400)
RBC: 3.51 MIL/uL — AB (ref 3.87–5.11)
RDW: 14 % (ref 11.5–15.5)
WBC: 8.2 10*3/uL (ref 4.0–10.5)

## 2017-04-10 LAB — COMPREHENSIVE METABOLIC PANEL
ALK PHOS: 35 U/L — AB (ref 38–126)
ALT: 29 U/L (ref 14–54)
AST: 28 U/L (ref 15–41)
Albumin: 3.5 g/dL (ref 3.5–5.0)
Anion gap: 7 (ref 5–15)
BUN: 6 mg/dL (ref 6–20)
CALCIUM: 9 mg/dL (ref 8.9–10.3)
CHLORIDE: 104 mmol/L (ref 101–111)
CO2: 24 mmol/L (ref 22–32)
CREATININE: 0.71 mg/dL (ref 0.44–1.00)
Glucose, Bld: 92 mg/dL (ref 65–99)
Potassium: 3.8 mmol/L (ref 3.5–5.1)
Sodium: 135 mmol/L (ref 135–145)
Total Bilirubin: 0.5 mg/dL (ref 0.3–1.2)
Total Protein: 6.3 g/dL — ABNORMAL LOW (ref 6.5–8.1)

## 2017-04-10 NOTE — MAU Note (Signed)
Pt here with c/o dizziness, weakness, and headache. Denies any bleeding or leaking of fluid. Some nausea, no vomiting.

## 2017-04-10 NOTE — Discharge Instructions (Signed)
Iron-Rich Diet Iron is a mineral that helps your body to produce hemoglobin. Hemoglobin is a protein in your red blood cells that carries oxygen to your body's tissues. Eating too little iron may cause you to feel weak and tired, and it can increase your risk for infection. Eating enough iron is necessary for your body's metabolism, muscle function, and nervous system. Iron is naturally found in many foods. It can also be added to foods or fortified in foods. There are two types of dietary iron:  Heme iron. Heme iron is absorbed by the body more easily than nonheme iron. Heme iron is found in meat, poultry, and fish.  Nonheme iron. Nonheme iron is found in dietary supplements, iron-fortified grains, beans, and vegetables.  You may need to follow an iron-rich diet if:  You have been diagnosed with iron deficiency or iron-deficiency anemia.  You have a condition that prevents you from absorbing dietary iron, such as: ? Infection in your intestines. ? Celiac disease. This involves long-lasting (chronic) inflammation of your intestines.  You do not eat enough iron.  You eat a diet that is high in foods that impair iron absorption.  You have lost a lot of blood.  You have heavy bleeding during your menstrual cycle.  You are pregnant.  What is my plan? Your health care provider may help you to determine how much iron you need per day based on your condition. Generally, when a person consumes sufficient amounts of iron in the diet, the following iron needs are met:  Men. ? 66-61 years old: 11 mg per day. ? 16-18 years old: 8 mg per day.  Women. ? 50-5 years old: 15 mg per day. ? 47-2 years old: 18 mg per day. ? Over 43 years old: 8 mg per day. ? Pregnant women: 27 mg per day. ? Breastfeeding women: 9 mg per day.  What do I need to know about an iron-rich diet?  Eat fresh fruits and vegetables that are high in vitamin C along with foods that are high in iron. This will help  increase the amount of iron that your body absorbs from food, especially with foods containing nonheme iron. Foods that are high in vitamin C include oranges, peppers, tomatoes, and mango.  Take iron supplements only as directed by your health care provider. Overdose of iron can be life-threatening. If you were prescribed iron supplements, take them with orange juice or a vitamin C supplement.  Cook foods in pots and pans that are made from iron.  Eat nonheme iron-containing foods alongside foods that are high in heme iron. This helps to improve your iron absorption.  Certain foods and drinks contain compounds that impair iron absorption. Avoid eating these foods in the same meal as iron-rich foods or with iron supplements. These include: ? Coffee, black tea, and red wine. ? Milk, dairy products, and foods that are high in calcium. ? Beans, soybeans, and peas. ? Whole grains.  When eating foods that contain both nonheme iron and compounds that impair iron absorption, follow these tips to absorb iron better. ? Soak beans overnight before cooking. ? Soak whole grains overnight and drain them before using. ? Ferment flours before baking, such as using yeast in bread dough. What foods can I eat? Grains Iron-fortified breakfast cereal. Iron-fortified whole-wheat bread. Enriched rice. Sprouted grains. Vegetables Spinach. Potatoes with skin. Green peas. Broccoli. Red and green bell peppers. Fermented vegetables. Fruits Prunes. Raisins. Oranges. Strawberries. Mango. Grapefruit. Meats and Other Protein Sources  Beef liver. Oysters. Beef. Shrimp. [place]. Chicken. [place]. Sardines. Chickpeas. Nuts. Tofu. Beverages Tomato juice. Fresh orange juice. Prune juice. Hibiscus tea. Fortified instant breakfast shakes. Condiments Tahini. Fermented soy sauce. Sweets and Desserts Black-strap molasses. Other Wheat germ. The items listed above may not be a complete list of recommended foods or beverages.  Contact your dietitian for more options. What foods are not recommended? Grains Whole grains. Bran cereal. Bran flour. Oats. Vegetables Artichokes. Brussels sprouts. Kale. Fruits Blueberries. Raspberries. Strawberries. Figs. Meats and Other Protein Sources Soybeans. Products made from soy protein. Dairy Milk. Cream. Cheese. Yogurt. Cottage cheese. Beverages Coffee. Black tea. Red wine. Sweets and Desserts Cocoa. Chocolate. Ice cream. Other Basil. Oregano. Parsley. The items listed above may not be a complete list of foods and beverages to avoid. Contact your dietitian for more information. This information is not intended to replace advice given to you by your health care provider. Make sure you discuss any questions you have with your health care provider. Document Released: 04/26/2005 Document Revised: 04/01/2016 Document Reviewed: 04/09/2014 Elsevier Interactive Patient Education  2018 [place] Headache A headache is pain or discomfort felt around the head or neck area. The specific cause of a headache may not be found. There are many causes and types of headaches. A few common ones are:  Tension headaches.  Migraine headaches.  Cluster headaches.  Chronic daily headaches.  Follow these instructions at home: Watch your condition for any changes. Take these steps to help with your condition: Managing pain  Take over-the-counter and prescription medicines only as told by your health care provider.  Lie down in a dark, quiet room when you have a headache.  If directed, apply ice to the head and neck area: ? Put ice in a plastic bag. ? Place a towel between your skin and the bag. ? Leave the ice on for 20 minutes, 2-3 times per day.  Use a heating pad or hot shower to apply heat to the head and neck area as told by your health care provider.  Keep lights dim if bright lights bother you or make your headaches worse. Eating and drinking  Eat meals on a  regular schedule.  Limit alcohol use.  Decrease the amount of caffeine you drink, or stop drinking caffeine. General instructions  Keep all follow-up visits as told by your health care provider. This is important.  Keep a headache journal to help find out what may trigger your headaches. For example, write down: ? What you eat and drink. ? How much sleep you get. ? Any change to your diet or medicines.  Try massage or other relaxation techniques.  Limit stress.  Sit up straight, and do not tense your muscles.  Do not use tobacco products, including cigarettes, chewing tobacco, or e-cigarettes. If you need help quitting, ask your health care provider.  Exercise regularly as told by your health care provider.  Sleep on a regular schedule. Get 7-9 hours of sleep, or the amount recommended by your health care provider. Contact a health care provider if:  Your symptoms are not helped by medicine.  You have a headache that is different from the usual headache.  You have nausea or you vomit.  You have a fever. Get help right away if:  Your headache becomes severe.  You have repeated vomiting.  You have a stiff neck.  You have a loss of vision.  You have problems with speech.  You have pain in the eye or ear.  You have muscular weakness or loss of muscle control.  You lose your balance or have trouble walking.  You feel faint or pass out.  You have confusion. This information is not intended to replace advice given to you by your health care provider. Make sure you discuss any questions you have with your health care provider. Document Released: 09/12/2005 Document Revised: 02/18/2016 Document Reviewed: 01/05/2015 Elsevier Interactive Patient Education  2017 [place] of Pregnancy The second trimester is from week 14 through week 27 (months 4 through 6). The second trimester is often a time when you feel your best. Your body has adjusted to  being pregnant, and you begin to feel better physically. Usually, morning sickness has lessened or quit completely, you may have more energy, and you may have an increase in appetite. The second trimester is also a time when the fetus is growing rapidly. At the end of the sixth month, the fetus is about 9 inches long and weighs about 1 pounds. You will likely begin to feel the baby move (quickening) between 16 and 20 weeks of pregnancy. Body changes during your second trimester Your body continues to go through many changes during your second trimester. The changes vary from woman to woman.  Your weight will continue to increase. You will notice your lower abdomen bulging out.  You may begin to get stretch marks on your hips, abdomen, and breasts.  You may develop headaches that can be relieved by medicines. The medicines should be approved by your health care provider.  You may urinate more often because the fetus is pressing on your bladder.  You may develop or continue to have heartburn as a result of your pregnancy.  You may develop constipation because certain hormones are causing the muscles that push waste through your intestines to slow down.  You may develop hemorrhoids or swollen, bulging veins (varicose veins).  You may have back pain. This is caused by: ? Weight gain. ? Pregnancy hormones that are relaxing the joints in your pelvis. ? A shift in weight and the muscles that support your balance.  Your breasts will continue to grow and they will continue to become tender.  Your gums may bleed and may be sensitive to brushing and flossing.  Dark spots or blotches (chloasma, mask of pregnancy) may develop on your face. This will likely fade after the baby is born.  A dark line from your belly button to the pubic area (linea nigra) may appear. This will likely fade after the baby is born.  You may have changes in your hair. These can include thickening of your hair, rapid  growth, and changes in texture. Some women also have hair loss during or after pregnancy, or hair that feels dry or thin. Your hair will most likely return to normal after your baby is born.  What to expect at prenatal visits During a routine prenatal visit:  You will be weighed to make sure you and the fetus are growing normally.  Your blood pressure will be taken.  Your abdomen will be measured to track your baby's growth.  The fetal heartbeat will be listened to.  Any test results from the previous visit will be discussed.  Your health care provider may ask you:  How you are feeling.  If you are feeling the baby move.  If you have had any abnormal symptoms, such as leaking fluid, bleeding, severe headaches, or abdominal cramping.  If you are using any tobacco products,  including cigarettes, chewing tobacco, and electronic cigarettes.  If you have any questions.  Other tests that may be performed during your second trimester include:  Blood tests that check for: ? Low iron levels (anemia). ? High blood sugar that affects pregnant women (gestational diabetes) between 73 and 28 weeks. ? Rh antibodies. This is to check for a protein on red blood cells (Rh factor).  Urine tests to check for infections, diabetes, or protein in the urine.  An ultrasound to confirm the proper growth and development of the baby.  An amniocentesis to check for possible genetic problems.  Fetal screens for spina bifida and Down syndrome.  HIV (human immunodeficiency virus) testing. Routine prenatal testing includes screening for HIV, unless you choose not to have this test.  Follow these instructions at home: Medicines  Follow your health care provider's instructions regarding medicine use. Specific medicines may be either safe or unsafe to take during pregnancy.  Take a prenatal vitamin that contains at least 600 micrograms (mcg) of folic acid.  If you develop constipation, try taking a  stool softener if your health care provider approves. Eating and drinking  Eat a balanced diet that includes fresh fruits and vegetables, whole grains, good sources of protein such as meat, eggs, or tofu, and low-fat dairy. Your health care provider will help you determine the amount of weight gain that is right for you.  Avoid raw meat and uncooked cheese. These carry germs that can cause birth defects in the baby.  If you have low calcium intake from food, talk to your health care provider about whether you should take a daily calcium supplement.  Limit foods that are high in fat and processed sugars, such as fried and sweet foods.  To prevent constipation: ? Drink enough fluid to keep your urine clear or pale yellow. ? Eat foods that are high in fiber, such as fresh fruits and vegetables, whole grains, and beans. Activity  Exercise only as directed by your health care provider. Most women can continue their usual exercise routine during pregnancy. Try to exercise for 30 minutes at least 5 days a week. Stop exercising if you experience uterine contractions.  Avoid heavy lifting, wear low heel shoes, and practice good posture.  A sexual relationship may be continued unless your health care provider directs you otherwise. Relieving pain and discomfort  Wear a good support bra to prevent discomfort from breast tenderness.  Take warm sitz baths to soothe any pain or discomfort caused by hemorrhoids. Use hemorrhoid cream if your health care provider approves.  Rest with your legs elevated if you have leg cramps or low back pain.  If you develop varicose veins, wear support hose. Elevate your feet for 15 minutes, 3-4 times a day. Limit salt in your diet. Prenatal Care  Write down your questions. Take them to your prenatal visits.  Keep all your prenatal visits as told by your health care provider. This is important. Safety  Wear your seat belt at all times when driving.  Make a  list of emergency phone numbers, including numbers for family, friends, the hospital, and police and fire departments. General instructions  Ask your health care provider for a referral to a local prenatal education class. Begin classes no later than the beginning of month 6 of your pregnancy.  Ask for help if you have counseling or nutritional needs during pregnancy. Your health care provider can offer advice or refer you to specialists for help with various needs.  Do not use hot tubs, steam rooms, or saunas.  Do not douche or use tampons or scented sanitary pads.  Do not cross your legs for long periods of time.  Avoid cat litter boxes and soil used by cats. These carry germs that can cause birth defects in the baby and possibly loss of the fetus by miscarriage or stillbirth.  Avoid all smoking, herbs, alcohol, and unprescribed drugs. Chemicals in these products can affect the formation and growth of the baby.  Do not use any products that contain nicotine or tobacco, such as cigarettes and e-cigarettes. If you need help quitting, ask your health care provider.  Visit your dentist if you have not gone yet during your pregnancy. Use a soft toothbrush to brush your teeth and be gentle when you floss. Contact a health care provider if:  You have dizziness.  You have mild pelvic cramps, pelvic pressure, or nagging pain in the abdominal area.  You have persistent nausea, vomiting, or diarrhea.  You have a bad smelling vaginal discharge.  You have pain when you urinate. Get help right away if:  You have a fever.  You are leaking fluid from your vagina.  You have spotting or bleeding from your vagina.  You have severe abdominal cramping or pain.  You have rapid weight gain or weight loss.  You have shortness of breath with chest pain.  You notice sudden or extreme swelling of your face, hands, ankles, feet, or legs.  You have not felt your baby move in over an hour.  You  have severe headaches that do not go away when you take medicine.  You have vision changes. Summary  The second trimester is from week 14 through week 27 (months 4 through 6). It is also a time when the fetus is growing rapidly.  Your body goes through many changes during pregnancy. The changes vary from woman to woman.  Avoid all smoking, herbs, alcohol, and unprescribed drugs. These chemicals affect the formation and growth your baby.  Do not use any tobacco products, such as cigarettes, chewing tobacco, and e-cigarettes. If you need help quitting, ask your health care provider.  Contact your health care provider if you have any questions. Keep all prenatal visits as told by your health care provider. This is important. This information is not intended to replace advice given to you by your health care provider. Make sure you discuss any questions you have with your health care provider. Document Released: 09/06/2001 Document Revised: 02/18/2016 Document Reviewed: 11/13/2012 Elsevier Interactive Patient Education  2017 [employer].

## 2017-04-10 NOTE — MAU Provider Note (Signed)
History    CSN: [id number]  Arrival date and time: 04/10/17 0120  First Provider Initiated Contact with Patient 04/10/17 0217     Chief Complaint  Patient presents with  . Dizziness  . Fatigue   HPI Jennifer Mayer is a 31 y.o. G3P1011 at [redacted]w[redacted]d who presents with dizziness, headache and nausea that started at 2100. She rates the headache a 3/10 and has not taken anything for the pain. She reports having 2 bottles of water and a sip of coke today total, states "I don't like to drink water." She reports intermittent nausea but no vomiting. Denies any abdominal pain, vaginal bleeding, leaking of fluid or vaginal discharge. Patient receives prenatal care at [facility] in [place].  OB History    Gravida Para Term Preterm AB Living   3 1 1  0 1 1   SAB TAB Ectopic Multiple Live Births   1 0 0 0 1     Past Medical History:  Diagnosis Date  . Abnormal Pap smear   . Depression   . Headache(784.0)   . Hx of cardiovascular stress test    Lexiscan Myoview (12/15):  EF 75%, breast atten, Low Risk  . Hx of echocardiogram    Echo (12/15):  EF 60-65%, no RWMA, no effusion  . Hx of varicella   . Thyroid nodule    Past Surgical History:  Procedure Laterality Date  . GYNECOLOGIC CRYOSURGERY    . LEEP    . WISDOM TOOTH EXTRACTION     Family History  Problem Relation Age of Onset  . Hypertension Mother   . Hypertension Father   . Heart attack Maternal Grandfather   . Other Neg Hx   . Stroke Neg Hx    Social History  Substance Use Topics  . Smoking status: Never Smoker  . Smokeless tobacco: Never Used  . Alcohol use No   Allergies:  Allergies  Allergen Reactions  . Sulfa Antibiotics Other (See Comments)    Leg pain  . Flagyl [Metronidazole Hcl] Hives  . Morphine And Related Itching    Severe   Prescriptions Prior to Admission  Medication Sig Dispense Refill Last Dose  . flintstones complete (FLINTSTONES) 60 MG chewable tablet Chew 1 tablet by mouth daily.   04/10/2017 at  Unknown time  . ibuprofen (ADVIL,MOTRIN) 200 MG tablet Take 200 mg by mouth every 6 (six) hours as needed for moderate pain.   More than a month at Unknown time  . potassium chloride SA (K-DUR,KLOR-CON) 20 MEQ tablet Take 2 tablets (40 mEq total) by mouth daily. 5 tablet 0    Review of Systems  Constitutional: Negative.  Negative for chills and fever.  HENT: Negative.   Respiratory: Negative.  Negative for shortness of breath.   Cardiovascular: Negative.  Negative for chest pain.  Gastrointestinal: Positive for nausea. Negative for abdominal pain, constipation, diarrhea and vomiting.  Genitourinary: Negative.  Negative for dysuria, vaginal bleeding and vaginal discharge.  Neurological: Positive for dizziness and headaches.  Psychiatric/Behavioral: Negative.    Physical Exam   Blood pressure 97/77, pulse 97, temperature 98.2 F (36.8 C), temperature source Oral, resp. rate 18, height 5\' 9"  (1.753 m), weight 177 lb (80.3 kg), SpO2 100 %, unknown if currently breastfeeding.  Patient Vitals for the past 24 hrs:  BP Temp Temp src Pulse Resp SpO2 Height Weight  04/10/17 0242 97/77 - - 97 - - - -  04/10/17 0241 106/68 - - 81 - - - -  04/10/17 0240  102/67 - - 77 - - - -  04/10/17 0146 104/61 98.2 F (36.8 C) Oral 83 18 100 % 5\' 9"  (1.753 m) 177 lb (80.3 kg)   Physical Exam  Nursing note and vitals reviewed. Constitutional: She is oriented to person, place, and time. She appears well-developed and well-nourished.  HENT:  Head: Normocephalic and atraumatic.  Eyes: Conjunctivae are normal. No scleral icterus.  Cardiovascular: Normal rate, regular rhythm and normal heart sounds.   Respiratory: Effort normal and breath sounds normal. No respiratory distress.  GI: Soft. She exhibits no distension. There is no tenderness.  Neurological: She is alert and oriented to person, place, and time. She has normal strength. No cranial nerve deficit or sensory deficit.  Skin: Skin is warm and dry.   Psychiatric: She has a normal mood and affect. Her behavior is normal. Judgment and thought content normal.   FHT: 155 bpm  MAU Course  Procedures Results for orders placed or performed during the hospital encounter of 04/10/17 (from the past 24 hour(s))  Urinalysis, Routine w reflex microscopic     Status: Abnormal   Collection Time: 04/10/17  1:45 AM  Result Value Ref Range   Color, Urine STRAW (A) YELLOW   APPearance CLEAR CLEAR   Specific Gravity, Urine 1.004 (L) 1.005 - 1.030   pH 7.0 5.0 - 8.0   Glucose, UA NEGATIVE NEGATIVE mg/dL   Hgb urine dipstick NEGATIVE NEGATIVE   Bilirubin Urine NEGATIVE NEGATIVE   Ketones, ur NEGATIVE NEGATIVE mg/dL   Protein, ur NEGATIVE NEGATIVE mg/dL   Nitrite NEGATIVE NEGATIVE   Leukocytes, UA NEGATIVE NEGATIVE  CBC     Status: Abnormal   Collection Time: 04/10/17  2:34 AM  Result Value Ref Range   WBC 8.2 4.0 - 10.5 K/uL   RBC 3.51 (L) 3.87 - 5.11 MIL/uL   Hemoglobin 10.3 (L) 12.0 - 15.0 g/dL   HCT 56.230.9 (L) 13.036.0 - 86.546.0 %   MCV 88.0 78.0 - 100.0 fL   MCH 29.3 26.0 - 34.0 pg   MCHC 33.3 30.0 - 36.0 g/dL   RDW 78.414.0 69.611.5 - 29.515.5 %   Platelets 219 150 - 400 K/uL  Comprehensive metabolic panel     Status: Abnormal   Collection Time: 04/10/17  2:34 AM  Result Value Ref Range   Sodium 135 135 - 145 mmol/L   Potassium 3.8 3.5 - 5.1 mmol/L   Chloride 104 101 - 111 mmol/L   CO2 24 22 - 32 mmol/L   Glucose, Bld 92 65 - 99 mg/dL   BUN 6 6 - 20 mg/dL   Creatinine, Ser 2.840.71 0.44 - 1.00 mg/dL   Calcium 9.0 8.9 - 13.210.3 mg/dL   Total Protein 6.3 (L) 6.5 - 8.1 g/dL   Albumin 3.5 3.5 - 5.0 g/dL   AST 28 15 - 41 U/L   ALT 29 14 - 54 U/L   Alkaline Phosphatase 35 (L) 38 - 126 U/L   Total Bilirubin 0.5 0.3 - 1.2 mg/dL   GFR calc non Af Amer >60 >60 mL/min   GFR calc Af Amer >60 >60 mL/min   Anion gap 7 5 - 15   MDM UA PO hydration CBC, CMP Orthostatic vital signs Patient refused medication for pain and nausea while in MAU Assessment and Plan    1. Anemia during pregnancy in second trimester   2. Pregnancy headache in second trimester    -Discharge patient home in stable condition -Prescription for iron supplement sent to patient's pharmacy -  Follow up with [facility] as scheduled for prenatal care -Encouraged to return here or to other Urgent Care/ED if she develops worsening of symptoms, increase in pain, fever, or other concerning symptoms.  Cleone Slim SNM 04/10/2017, 3:23 AM   I confirm that I have verified the information documented in the nurse midwife student's note and that I have also personally reperformed the physical exam and all medical decision making activities.   Katrinka Blazing, [place], CNM 04/10/2017 4:09 AM

## 2017-04-27 ENCOUNTER — Inpatient Hospital Stay (HOSPITAL_COMMUNITY): Payer: Managed Care, Other (non HMO)

## 2017-04-27 ENCOUNTER — Inpatient Hospital Stay (HOSPITAL_COMMUNITY)
Admission: AD | Admit: 2017-04-27 | Discharge: 2017-04-28 | Disposition: A | Discharge status: Home / Self Care | Payer: Managed Care, Other (non HMO) | Source: Ambulatory Visit | Attending: Obstetrics and Gynecology | Admitting: Obstetrics and Gynecology

## 2017-04-27 ENCOUNTER — Encounter (HOSPITAL_COMMUNITY): Payer: Self-pay

## 2017-04-27 DIAGNOSIS — Z8619 Personal history of other infectious and parasitic diseases: Secondary | ICD-10-CM | POA: Insufficient documentation

## 2017-04-27 DIAGNOSIS — Z881 Allergy status to other antibiotic agents status: Secondary | ICD-10-CM | POA: Insufficient documentation

## 2017-04-27 DIAGNOSIS — N841 Polyp of cervix uteri: Secondary | ICD-10-CM | POA: Insufficient documentation

## 2017-04-27 DIAGNOSIS — O3442 Maternal care for other abnormalities of cervix, second trimester: Secondary | ICD-10-CM | POA: Insufficient documentation

## 2017-04-27 DIAGNOSIS — Z9889 Other specified postprocedural states: Secondary | ICD-10-CM | POA: Diagnosis not present

## 2017-04-27 DIAGNOSIS — Z8249 Family history of ischemic heart disease and other diseases of the circulatory system: Secondary | ICD-10-CM | POA: Diagnosis not present

## 2017-04-27 DIAGNOSIS — Z3A19 19 weeks gestation of pregnancy: Secondary | ICD-10-CM | POA: Insufficient documentation

## 2017-04-27 DIAGNOSIS — Z882 Allergy status to sulfonamides status: Secondary | ICD-10-CM | POA: Insufficient documentation

## 2017-04-27 DIAGNOSIS — O209 Hemorrhage in early pregnancy, unspecified: Secondary | ICD-10-CM | POA: Insufficient documentation

## 2017-04-27 DIAGNOSIS — O4692 Antepartum hemorrhage, unspecified, second trimester: Secondary | ICD-10-CM | POA: Diagnosis not present

## 2017-04-27 DIAGNOSIS — Z885 Allergy status to narcotic agent status: Secondary | ICD-10-CM | POA: Insufficient documentation

## 2017-04-27 NOTE — MAU Note (Signed)
Urine in the lab  

## 2017-04-27 NOTE — MAU Provider Note (Signed)
History    CSN: [id number]  Arrival date and time: 04/27/17 2133   First Provider Initiated Contact with Patient 04/27/17 2216     Chief Complaint  Patient presents with  . Vaginal Bleeding   HPI Jennifer Mayer is a 31 y.o. G3P1011 at 4836w4d who presents with vaginal bleeding after intercourse. She states the episode happened an hour ago and it was bright red bleeding like a period. She denies any pain or leaking of fluid. [facility] and denies any problems in the pregnancy.   Blood type A pos.   OB History    Gravida Para Term Preterm AB Living   3 1 1  0 1 1   SAB TAB Ectopic Multiple Live Births   1 0 0 0 1       Past Medical History:  Diagnosis Date  . Abnormal Pap smear   . Depression   . Headache(784.0)   . Hx of cardiovascular stress test    Lexiscan Myoview (12/15):  EF 75%, breast atten, Low Risk  . Hx of echocardiogram    Echo (12/15):  EF 60-65%, no RWMA, no effusion  . Hx of varicella   . Thyroid nodule     Past Surgical History:  Procedure Laterality Date  . GYNECOLOGIC CRYOSURGERY    . LEEP    . WISDOM TOOTH EXTRACTION      Family History  Problem Relation Age of Onset  . Hypertension Mother   . Hypertension Father   . Heart attack Maternal Grandfather   . Other Neg Hx   . Stroke Neg Hx     Social History  Substance Use Topics  . Smoking status: Never Smoker  . Smokeless tobacco: Never Used  . Alcohol use No    Allergies:  Allergies  Allergen Reactions  . Sulfa Antibiotics Other (See Comments)    Leg pain  . Flagyl [Metronidazole Hcl] Hives  . Morphine And Related Itching    Severe    Prescriptions Prior to Admission  Medication Sig Dispense Refill Last Dose  . flintstones complete (FLINTSTONES) 60 MG chewable tablet Chew 1 tablet by mouth daily.   04/10/2017 at Unknown time    Review of Systems  Constitutional: Negative.  Negative for chills and fever.  HENT: Negative.   Respiratory: Negative.  Negative  for shortness of breath.   Cardiovascular: Negative.  Negative for chest pain.  Gastrointestinal: Negative.  Negative for abdominal pain.  Genitourinary: Positive for vaginal bleeding. Negative for dysuria and vaginal discharge.  Neurological: Negative.  Negative for dizziness and headaches.  Psychiatric/Behavioral: Negative.    Physical Exam   Blood pressure 124/80, pulse 95, temperature 98.9 F (37.2 C), temperature source Oral, resp. rate 16, height 5\' 9"  (1.753 m), weight 181 lb (82.1 kg), SpO2 100 %, unknown if currently breastfeeding.  Physical Exam  Nursing note and vitals reviewed. Constitutional: She appears well-developed and well-nourished.  HENT:  Head: Normocephalic and atraumatic.  Eyes: Conjunctivae are normal.  Respiratory: Effort normal and breath sounds normal. No respiratory distress.  GI: Soft. She exhibits no distension. There is no tenderness. There is no guarding.  Genitourinary: Cervix exhibits no motion tenderness. There is bleeding (scant, bright red) in the vagina.    Neurological: She is alert.  Skin: Skin is warm and dry.  Psychiatric: She has a normal mood and affect. Her behavior is normal. Judgment and thought content normal.   Dilation: Closed Effacement (%): Thick Cervical Position: Posterior Exam  by:: Antony Odeaaroline Neil, CNM student  FHR: 150bpm MAU Course  Procedures None  MDM A Pos blood type US OB Limited- posterior placenta, no abruption or previa, CL 3cm, AFI subjectively normal Minimal bleeding, no abdominal pain, VSS, no signs or symptoms of any acute processes at this time.  Assessment and Plan   1. Cervical polyp   2. [redacted] weeks gestation of pregnancy   3. Vaginal bleeding in pregnancy, second trimester    -Discharge patient home in stable condition -Pelvic rest discussed with patient -[facility] as [facility] -Encouraged to return here or to other Urgent Care/ED if she develops worsening of symptoms,  increase in pain, fever, or other concerning symptoms.   Cleone SlimCaroline Neill SNM 04/27/2017, 11:57 PM  I confirm that I have verified the information documented in the student  nurse midwife's note and that I have also personally reperformed the physical exam and all medical decision making activities.Two polyps seen on speculum exam. No active bleeding. Cervix long and closed. Discussed Hx, labs, exam. US w/ Dr. Vergie LivingPickens. Agrees w/ POC. D/C home on Pelvic rest.    Katrinka BlazingSmith, [place], CNM 04/28/2017 6:22 AM

## 2017-04-27 NOTE — MAU Note (Signed)
Pt here for vaginal bleeding after intercourse about 30 mins ago. States the bleeding was like a period and is still coming out. Pt denies pain.

## 2017-04-28 DIAGNOSIS — N841 Polyp of cervix uteri: Secondary | ICD-10-CM | POA: Diagnosis not present

## 2017-04-28 DIAGNOSIS — Z3A19 19 weeks gestation of pregnancy: Secondary | ICD-10-CM | POA: Diagnosis not present

## 2017-04-28 DIAGNOSIS — O4692 Antepartum hemorrhage, unspecified, second trimester: Secondary | ICD-10-CM | POA: Diagnosis not present

## 2017-04-28 NOTE — Discharge Instructions (Signed)
Pelvic Rest °Pelvic rest may be recommended if: °· Your placenta is partially or completely covering the opening of your cervix (placenta previa). °· There is bleeding between the wall of the uterus and the amniotic sac in the first trimester of pregnancy (subchorionic hemorrhage). °· You went into labor too early (preterm labor). ° °Based on your overall health and the health of your baby, your health care provider will decide if pelvic rest is right for you. °How do I rest my pelvis? °For as long as told by your health care provider: °· Do not have sex, sexual stimulation, or an orgasm. °· Do not use tampons. Do not douche. Do not put anything in your vagina. °· Do not lift anything that is heavier than 10 lb (4.5 kg). °· Avoid activities that take a lot of effort (are strenuous). °· Avoid any activity in which your pelvic muscles could become strained. ° °When should I seek medical care? °Seek medical care if you have: °· Cramping pain in your lower abdomen. °· Vaginal discharge. °· A low, dull backache. °· Regular contractions. °· Uterine tightening. ° °When should I seek immediate medical care? °Seek immediate medical care if: °· You have vaginal bleeding and you are pregnant. ° °This information is not intended to replace advice given to you by your health care provider. Make sure you discuss any questions you have with your health care provider. °Document Released: 01/07/2011 Document Revised: 02/18/2016 Document Reviewed: 03/16/2015 °Elsevier Interactive Patient Education © 2018 Elsevier Inc. ° °

## 2017-05-26 ENCOUNTER — Encounter (HOSPITAL_COMMUNITY): Payer: Self-pay | Admitting: *Deleted

## 2017-05-26 ENCOUNTER — Inpatient Hospital Stay (HOSPITAL_COMMUNITY)
Admission: AD | Admit: 2017-05-26 | Discharge: 2017-05-26 | Disposition: A | Discharge status: Home / Self Care | Payer: Managed Care, Other (non HMO) | Source: Ambulatory Visit | Attending: Obstetrics & Gynecology | Admitting: Obstetrics & Gynecology

## 2017-05-26 DIAGNOSIS — O368120 Decreased fetal movements, second trimester, not applicable or unspecified: Secondary | ICD-10-CM | POA: Diagnosis not present

## 2017-05-26 DIAGNOSIS — Z3689 Encounter for other specified antenatal screening: Secondary | ICD-10-CM

## 2017-05-26 DIAGNOSIS — Z3A00 Weeks of gestation of pregnancy not specified: Secondary | ICD-10-CM | POA: Diagnosis not present

## 2017-05-26 NOTE — Progress Notes (Signed)
Esignature page not working in room 1.  Patient signed paper copy.

## 2017-05-26 NOTE — Discharge Instructions (Signed)

## 2017-05-26 NOTE — MAU Provider Note (Signed)
History   [id number]   Chief Complaint  Patient presents with  . Decreased Fetal Movement    HPI Jennifer Mayer is a 31 y.o. female  G3P1011 here with report of decreased fetal movement since this morning.  Reports feeling the baby move 7 times in the last 2 hours. Increase in movement since arriving to MAU.  Denies vaginal bleeding or leaking of fluid or abdominal pain.   No LMP recorded. Patient is pregnant.  OB History  Gravida Para Term Preterm AB Living  3 1 1  0 1 1  SAB TAB Ectopic Multiple Live Births  1 0 0 0 1    # Outcome Date GA Lbr Len/2nd Weight Sex Delivery Anes PTL Lv  3 Current           2 Term 11/21/12 [redacted]w[redacted]d 06:00 / 00:34 7 lb 12.7 oz (3.535 kg) F Vag-Spont EPI  LIV  1 SAB  [redacted]w[redacted]d       FD      Past Medical History:  Diagnosis Date  . Abnormal Pap smear   . Hx of cardiovascular stress test    Lexiscan Myoview (12/15):  EF 75%, breast atten, Low Risk  . Hx of echocardiogram    Echo (12/15):  EF 60-65%, no RWMA, no effusion  . Hx of varicella   . Thyroid nodule     Family History  Problem Relation Age of Onset  . Hypertension Mother   . Hypertension Father   . Heart attack Maternal Grandfather   . Other Neg Hx   . Stroke Neg Hx     Social History   Social History  . Marital status: Married    Spouse name: N/A  . Number of children: N/A  . Years of education: N/A   Social History Main Topics  . Smoking status: Never Smoker  . Smokeless tobacco: Never Used  . Alcohol use No  . Drug use: No  . Sexual activity: Yes   Other Topics Concern  . None   Social History Narrative  . None    Allergies  Allergen Reactions  . Sulfa Antibiotics Other (See Comments)    Leg pain  . Flagyl [Metronidazole Hcl] Hives  . Morphine And Related Itching    Severe    No current facility-administered medications on file prior to encounter.    Current Outpatient Prescriptions on File Prior to Encounter  Medication Sig Dispense Refill  . flintstones  complete (FLINTSTONES) 60 MG chewable tablet Chew 1 tablet by mouth daily.       Review of Systems  Constitutional: Negative.   Gastrointestinal: Negative.   Genitourinary: Negative.    Physical Exam   Vitals:   05/26/17 1447  BP: 110/73  Pulse: 85  Resp: 18  Temp: 98.5 F (36.9 C)  TempSrc: Oral  SpO2: 100%  Weight: 183 lb (83 kg)  Height: 5\' 8"  (1.727 m)    Physical Exam  Nursing note and vitals reviewed. Constitutional: She is oriented to person, place, and time. She appears well-developed and well-nourished. No distress.  HENT:  Head: Normocephalic and atraumatic.  Eyes: Conjunctivae are normal. Right eye exhibits no discharge. Left eye exhibits no discharge. No scleral icterus.  Neck: Normal range of motion.  Respiratory: Effort normal. No respiratory distress.  GI: Soft. There is no tenderness.  FH 24 cm (measured per patient request)  Neurological: She is alert and oriented to person, place, and time.  Skin: Skin is warm and dry. She is not  diaphoretic.  Psychiatric: She has a normal mood and affect. Her behavior is normal. Judgment and thought content normal.   Fetal Tracing:  Baseline: 135 Variability: moderate Accelerations: 15x15 Decelerations: none  Toco: none MAU Course  Procedures No results found for this or any previous visit (from the past 24 hour(s)).  MDM Reactive NST Increased fetal movement while in MAU  Assessment and Plan  A:  1. Decreased fetal movements in second trimester, single or unspecified fetus   2. NST (non-stress test) reactive    P: Discharge home Discussed reasons to return to MAU Keep f/u with OB   Judeth HornLawrence, Lindley Hiney, NP 05/26/2017 3:01 PM

## 2017-05-26 NOTE — MAU Note (Signed)
Patient states she had been feeling baby move less today.  Ate and drank and laid down and only felt 7 kicks in 2hours.  Wanted to be checked out.  Denies any pain or other complaints at this time.  States she started feeling lots of movement while in MAU lobby.

## 2017-10-03 ENCOUNTER — Other Ambulatory Visit: Payer: Managed Care, Other (non HMO)

## 2017-11-15 ENCOUNTER — Ambulatory Visit
Admission: RE | Admit: 2017-11-15 | Discharge: 2017-11-15 | Disposition: A | Discharge status: Home / Self Care | Payer: Managed Care, Other (non HMO) | Source: Ambulatory Visit | Attending: Physician Assistant | Admitting: Physician Assistant

## 2017-11-15 ENCOUNTER — Other Ambulatory Visit: Payer: Managed Care, Other (non HMO)

## 2017-11-15 ENCOUNTER — Other Ambulatory Visit: Payer: Self-pay | Admitting: Physician Assistant

## 2017-11-15 DIAGNOSIS — N6489 Other specified disorders of breast: Secondary | ICD-10-CM

## 2018-02-21 IMAGING — US US MFM OB LIMITED
1 series · 15 of 28 positions shown · non-contrast
Comparison: none

[Series 1: us mfm ob limited · 15 of 36 slices shown]
[im 1/36]
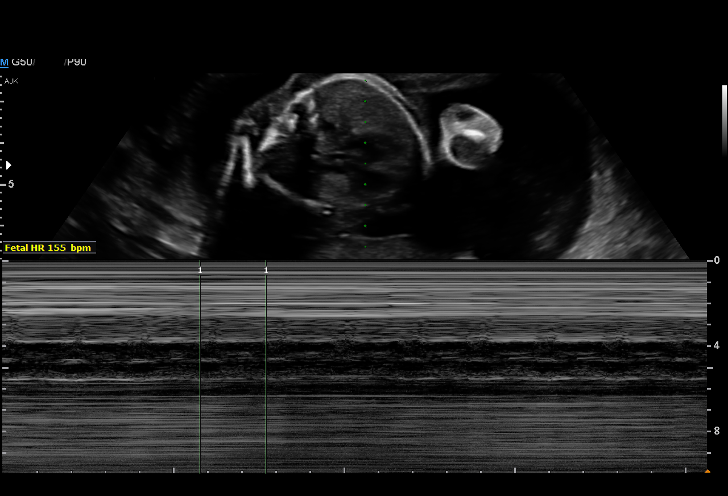
[im 3/36]
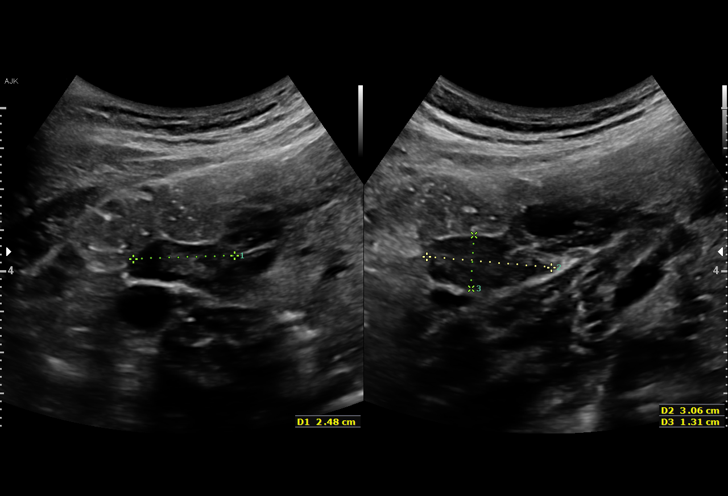
[im 6/36]
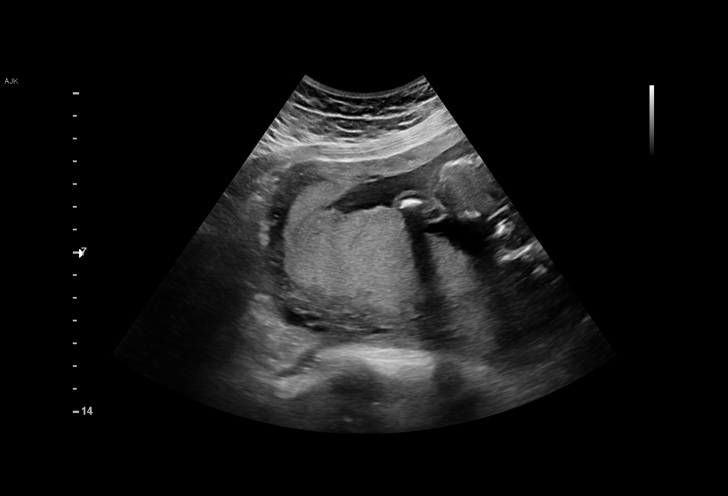
[im 8/36]
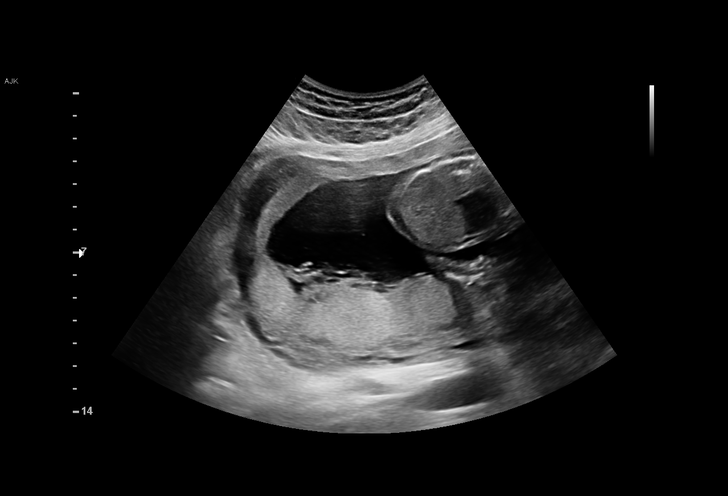
[im 11/36]
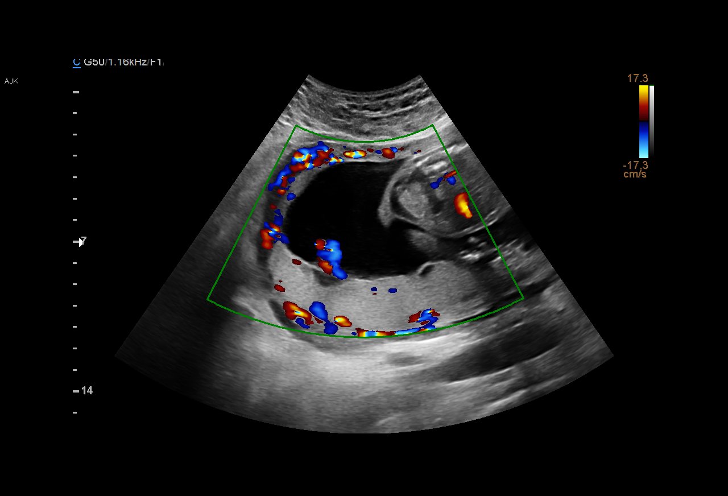
[im 13/36]
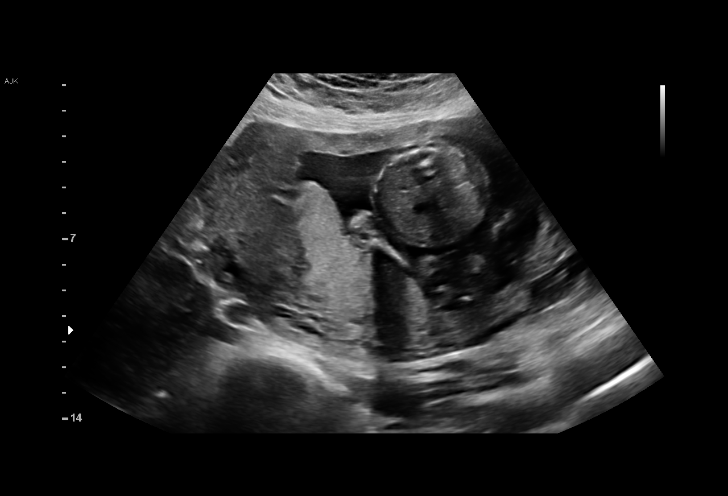
[im 16/36]
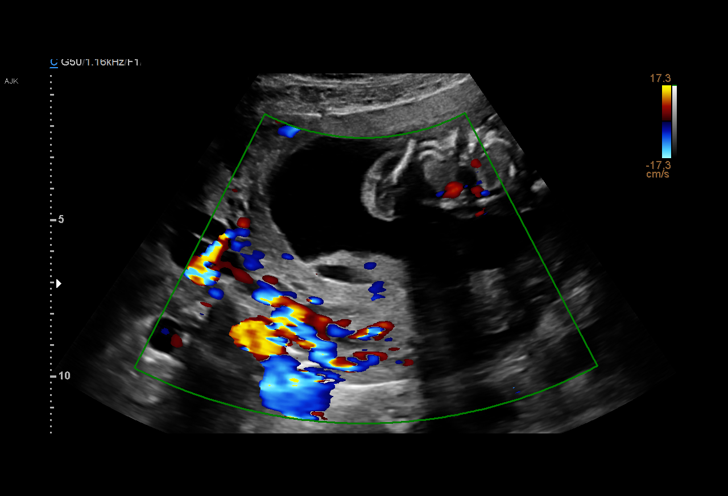
[im 19/36]
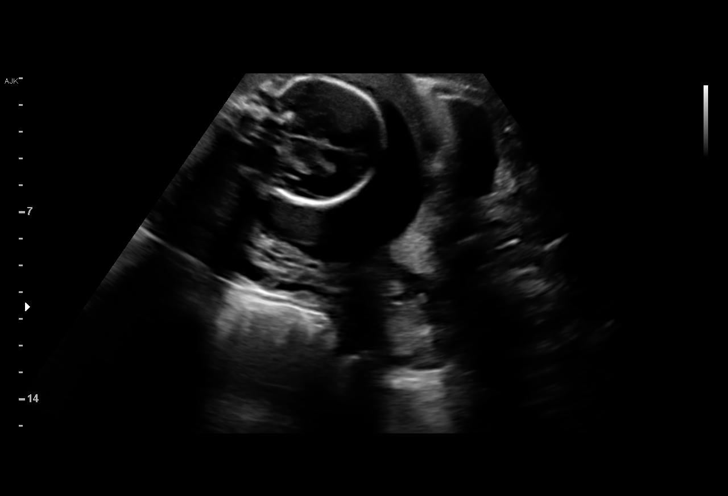
[im 20/36]
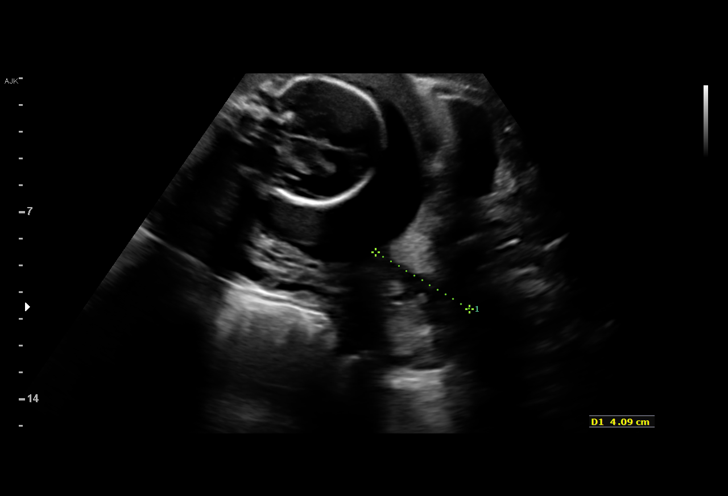
[im 23/36]
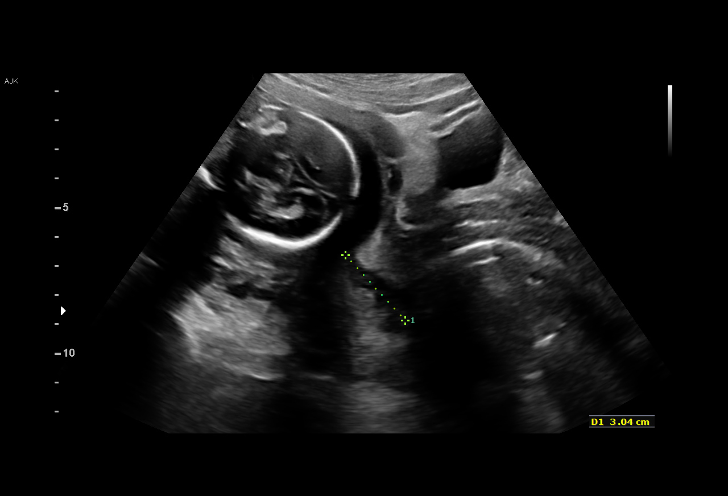
[im 25/36]
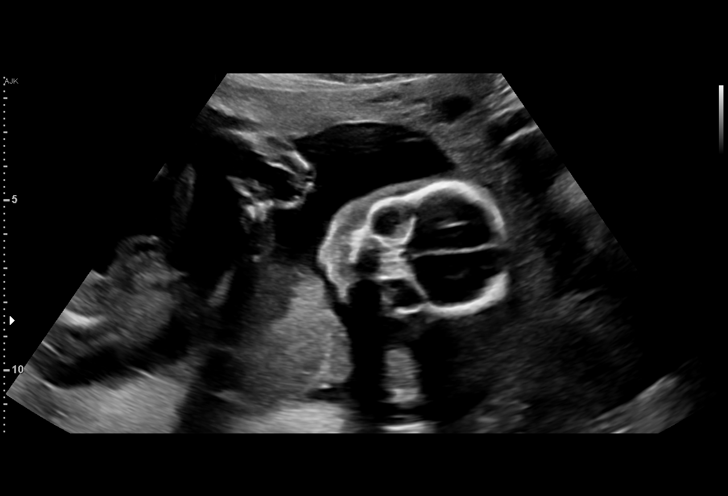
[im 28/36]
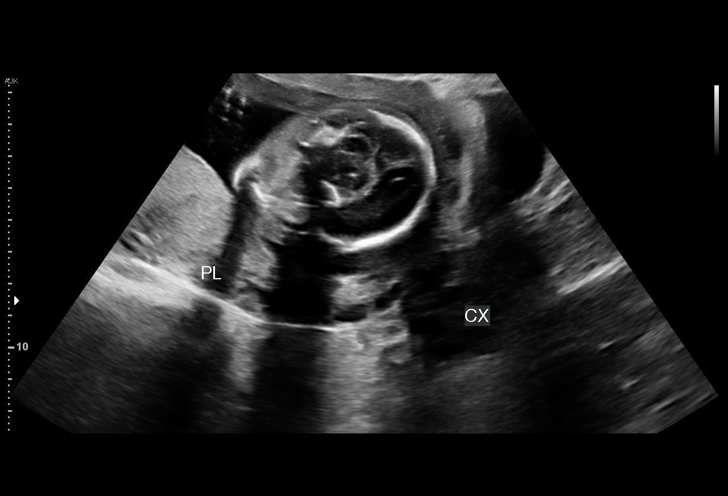
[im 30/36]
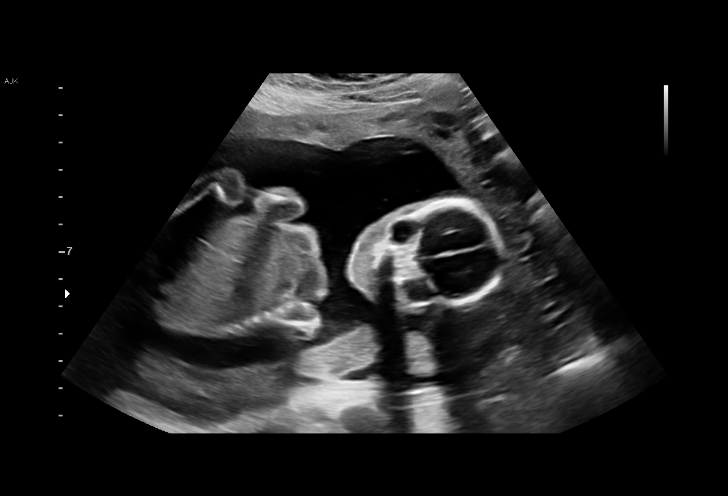
[im 33/36]
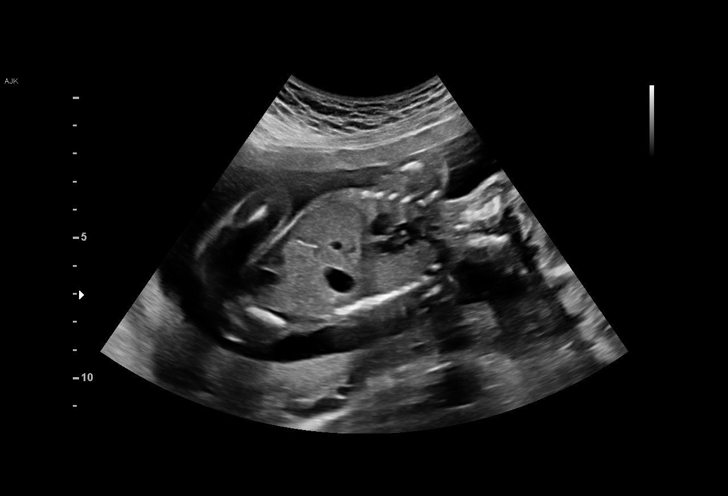
[im 36/36]
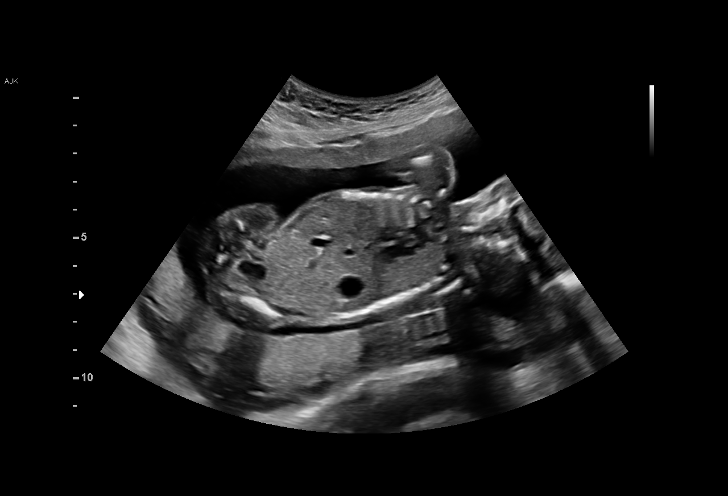

[15 of 28 positions shown; findings below may reference images not displayed]

1  TU YANCEY           [id number]      [id number]     [id number]
Indications

19 weeks gestation of pregnancy
Vaginal bleeding in pregnancy, second
trimester
OB History

Gravidity:    3         Term:   1        Prem:   0        SAB:   1
TOP:          0       Ectopic:  0        Living: 1
Fetal Evaluation

Num Of Fetuses:     1
Fetal Heart         155
Rate(bpm):
Cardiac Activity:   Observed
Presentation:       Cephalic
Placenta:           No abruption or previa seen, posterior
P. Cord Insertion:  Visualized, central

Amniotic Fluid
AFI FV:      Subjectively within normal limits

Largest Pocket(cm)
4.56

Comment:    Stomach, bladder, and diaphragm noted.
Gestational Age

Clinical EDD:  19w 4d                                        EDD:   09/17/17
Best:          19w 4d    Det. By:   Clinical EDD             EDD:   09/17/17
Cervix Uterus Adnexa
Cervix
Length:              3  cm.
Normal appearance by transabdominal scan.

Uterus
No abnormality visualized.

Left Ovary
Not visualized.

Right Ovary
Within normal limits.

Adnexa:       No abnormality visualized.
Impression

Single IUP at 19w 4d
Limited ultrasound performed due to vaginal bleeding
Posterior placenta without previa
No subchorionic fluid collections noted - no evidence of
abruption
Normal amniotic fluid volume
Recommendations

Follow-up ultrasounds as clinically indicated.

## 2019-10-01 ENCOUNTER — Other Ambulatory Visit: Payer: Self-pay | Admitting: Physician Assistant

## 2019-10-01 DIAGNOSIS — N63 Unspecified lump in unspecified breast: Secondary | ICD-10-CM

## 2019-10-07 ENCOUNTER — Other Ambulatory Visit: Payer: Self-pay | Admitting: Physician Assistant

## 2019-10-07 DIAGNOSIS — N63 Unspecified lump in unspecified breast: Secondary | ICD-10-CM

## 2019-10-08 ENCOUNTER — Other Ambulatory Visit: Payer: Self-pay | Admitting: Physician Assistant

## 2019-10-08 DIAGNOSIS — N63 Unspecified lump in unspecified breast: Secondary | ICD-10-CM

## 2019-10-17 ENCOUNTER — Other Ambulatory Visit: Payer: Self-pay | Admitting: Physician Assistant

## 2019-10-17 ENCOUNTER — Ambulatory Visit
Admission: RE | Admit: 2019-10-17 | Discharge: 2019-10-17 | Disposition: A | Discharge status: Home / Self Care | Payer: Managed Care, Other (non HMO) | Source: Ambulatory Visit | Attending: Physician Assistant | Admitting: Physician Assistant

## 2019-10-17 ENCOUNTER — Other Ambulatory Visit: Payer: Self-pay

## 2019-10-17 DIAGNOSIS — R2232 Localized swelling, mass and lump, left upper limb: Secondary | ICD-10-CM

## 2019-10-17 DIAGNOSIS — N63 Unspecified lump in unspecified breast: Secondary | ICD-10-CM

## 2019-11-27 NOTE — Progress Notes (Signed)
Virtual Visit via Telephone Note   This visit type was conducted due to national recommendations for restrictions regarding the COVID-19 Pandemic (e.g. social distancing) in an effort to limit this patient's exposure and mitigate transmission in our community.  Due to her co-morbid illnesses, this patient is at least at moderate risk for complications without adequate follow up.  This format is felt to be most appropriate for this patient at this time.  The patient did not have access to video technology/had technical difficulties with video requiring transitioning to audio format only (telephone).  All issues noted in this document were discussed and addressed.  No physical exam could be performed with this format.  Please refer to the patient's chart for her  consent to telehealth for [facility].   Date:  11/27/2019  ID:  Jennifer, Mayer 1986/02/04, MRN [id number]  Patient Location: Home Provider Location: Home  PCP:  No primary care provider on file.  Cardiologist:  No primary care provider on file.  Electrophysiologist:  None   Evaluation Performed:  Follow-Up Visit  Chief Complaint: Follow-up, palpitations  History of Present Illness:    Jennifer Mayer is a 34 y.o. female with a history of chest pain and palpitations who was last seen by Dr. Elease Hashimoto 03/24/2016.  Jennifer Mayer initially saw Dr. Elease Hashimoto as a referral for chest discomfort at which time she described chest pain after recent diagnosis of strep throat which was relieved with Motrin therapy. 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 She did have pleuritic chest pain which worsened with lying back and improved with sitting forward.  She was started on prednisone therapy.  In follow-up, she continued to have chest pain therefore stress test Myoview was performed which was found to be normal and she was continued on colchicine 0.6 mg p.o. twice daily and Motrin 400  to 600 mg 3 times daily treated for pericarditis.   At last office visit in 2017 patient seemed to be doing great from a pericarditis standpoint. She had some mild diastolic dysfunction on echocardiogram during pericarditis work-up however she had no symptoms of diastolic dysfunction on clinical exam and therefore no need to repeat the echocardiogram.  Today Jennifer Mayer states that she is doing well from a CV standpoint however has been noticing an increased frequency of her palpitations.  She reports that she was previously diagnosed with this and took metoprolol daily however did not like how it made her feel therefore she self discontinued.  Her palpitations have been very well controlled since that time without exacerbations however she states that she has been under more stress lately with work and feels that they have become more frequent.  She feels it mainly at night.  We discussed the plan of an as needed metoprolol dose however she did not like how this made her feel at follow-up we could possibly change to propanolol.  We will plan for a 2-week Zio patch for further monitoring and virtual follow-up in 1 month.  Otherwise, she denies shortness of breath, LE edema, orthopnea, PND, dizziness or syncope.  The patient does not have symptoms concerning for COVID-19 infection (fever, chills, cough, or new shortness of breath).   Past Medical History:  Diagnosis Date  . Abnormal Pap smear   . Hx of cardiovascular stress test    Lexiscan Myoview (12/15):  EF 75%, breast atten, Low Risk  . Hx of echocardiogram  Echo (12/15):  EF 60-65%, no RWMA, no effusion  . Hx of varicella   . Thyroid nodule    Past Surgical History:  Procedure Laterality Date  . GYNECOLOGIC CRYOSURGERY    . LEEP    . WISDOM TOOTH EXTRACTION       No outpatient medications have been marked as taking for the 11/29/19 encounter (Appointment) with Tommie Raymond, NP.     Allergies:   Sulfa antibiotics, Flagyl  [metronidazole hcl], and Morphine and related   Social History   Tobacco Use  . Smoking status: Never Smoker  . Smokeless tobacco: Never Used  Substance Use Topics  . Alcohol use: No    Alcohol/week: 0.0 standard drinks  . Drug use: No     Family Hx: The patient's family history includes Heart attack in her maternal grandfather; Hypertension in her father and mother. There is no history of Other or Stroke.  ROS:   Please see the history of present illness.     All other systems reviewed and are negative.   Prior CV studies:   The following studies were reviewed today:  None   Labs/Other Tests and Data Reviewed:    EKG:  No ECG reviewed.  Recent Labs: No results found for requested labs within last 8760 hours.   Recent Lipid Panel No results found for: CHOL, TRIG, HDL, CHOLHDL, LDLCALC, LDLDIRECT  Wt Readings from Last 3 Encounters:  05/26/17 183 lb (83 kg)  04/27/17 181 lb (82.1 kg)  04/10/17 177 lb (80.3 kg)     Objective:    Vital Signs:  There were no vitals taken for this visit.   VITAL SIGNS:  reviewed GEN:  no acute distress NEURO:  alert and oriented x 3, no obvious focal deficit PSYCH:  normal affect  ASSESSMENT & PLAN:    1.  Palpitations: -Reports history of palpitations for which she was prescribed metoprolol daily however she did not like how this made her feel therefore she self discontinued.  Reports her palpitations were stable for quite some time however has been having some increased stress in her life and therefore palpitations have become more frequent.  She has no associated symptoms. -Plan for as needed metoprolol tartrate 12.5 to 25 mg p.o. -ZIO monitor 2 weeks to assess for arrhythmia, AF -Virtual follow-up in 1 month -If metoprolol unsuccessful or patient unsatisfied, may try propanolol  2. Hx of pericarditis: -No recurrent symptoms  3.  Mild diastolic dysfunction: -Found per echocardiogram during pericarditis work-up -No  clinical symptoms of DD therefore no need to repeat echocardiogram at this time   COVID-19 Education: The signs and symptoms of COVID-19 were discussed with the patient and how to seek care for testing (follow up with PCP or arrange E-visit). The importance of social distancing was discussed today.  Time:   Today, I have spent 20 minutes with the patient with telehealth technology discussing the above problems.     Medication Adjustments/Labs and Tests Ordered: Current medicines are reviewed at length with the patient today.  Concerns regarding medicines are outlined above.   Tests Ordered: No orders of the defined types were placed in this encounter.   Medication Changes: No orders of the defined types were placed in this encounter.   Follow Up:  Virtual Visit  With myself or another APP in 1 month  Signed, Kathyrn Drown, NP  11/27/2019 1:08 PM    [facility]

## 2019-11-29 ENCOUNTER — Other Ambulatory Visit: Payer: Self-pay

## 2019-11-29 ENCOUNTER — Telehealth: Payer: Self-pay | Admitting: Radiology

## 2019-11-29 ENCOUNTER — Telehealth: Payer: Managed Care, Other (non HMO) | Admitting: Physician Assistant

## 2019-11-29 ENCOUNTER — Encounter: Payer: Self-pay | Admitting: Cardiology

## 2019-11-29 ENCOUNTER — Telehealth (INDEPENDENT_AMBULATORY_CARE_PROVIDER_SITE_OTHER): Payer: Managed Care, Other (non HMO) | Admitting: Cardiology

## 2019-11-29 VITALS — Ht 68.0 in | Wt 172.0 lb

## 2019-11-29 DIAGNOSIS — Z8679 Personal history of other diseases of the circulatory system: Secondary | ICD-10-CM

## 2019-11-29 DIAGNOSIS — R002 Palpitations: Secondary | ICD-10-CM | POA: Diagnosis not present

## 2019-11-29 MED ORDER — METOPROLOL TARTRATE 25 MG PO TABS: 12.5000 mg | ORAL_TABLET | Freq: Every day | ORAL | 5 refills | Status: AC | PRN

## 2019-11-29 NOTE — Patient Instructions (Addendum)
Medication Instructions:   Your physician has recommended you make the following change in your medication:   1) Start Metoprolol Tartrate 25 mg, 0.5-1 tablet by mouth as needed for palpitations  *If you need a refill on your cardiac medications before your next appointment, please call your pharmacy*  Lab Work:  None ordered today  If you have labs (blood work) drawn today and your tests are completely normal, you will receive your results only by: Marland Kitchen MyChart Message (if you have MyChart) OR . A paper copy in the mail If you have any lab test that is abnormal or we need to change your treatment, we will call you to review the results.  Testing/Procedures:  A zio monitor was ordered today. It will remain on for 14 days. You will then return monitor and event diary in provided box. It takes 1-2 weeks for report to be downloaded and returned to [place]. We will call you with the results. If monitor falls off or has orange flashing light, please call Zio for further instructions.   Follow-Up: At [facility], you and your health needs are our priority.  As part of our continuing mission to provide you with exceptional heart care, we have created designated Provider Care Teams.  These Care Teams include your primary Cardiologist (physician) and Advanced Practice Providers (APPs -  Physician Assistants and Nurse Practitioners) who all work together to provide you with the care you need, when you need it.  We recommend signing up for the patient portal called "MyChart".  Sign up information is provided on this After Visit Summary.  MyChart is used to connect with patients for Virtual Visits (Telemedicine).  Patients are able to view lab/test results, encounter notes, upcoming appointments, etc.  Non-urgent messages can be sent to your provider as well.   To learn more about what you can do with MyChart, go to [email or web address].    On 01/14/20 at 11:45AM with Georgie Chard, NP

## 2019-11-29 NOTE — Telephone Encounter (Signed)
Enrolled patient for a 14 day Zio monitor to be mailed to patients home.  

## 2019-12-22 ENCOUNTER — Other Ambulatory Visit (INDEPENDENT_AMBULATORY_CARE_PROVIDER_SITE_OTHER): Payer: Managed Care, Other (non HMO)

## 2019-12-22 DIAGNOSIS — R002 Palpitations: Secondary | ICD-10-CM | POA: Diagnosis not present

## 2020-01-04 NOTE — Progress Notes (Deleted)
{Choose 1 Note Type (Video or Telephone):[phone]}   The patient was identified using 2 identifiers.  Date:  01/04/2020   ID:  Jennifer Mayer, DOB 06/26/1986, MRN [id number]  {Patient Location:[phone]::"Home"} {Provider Location:[id number]::"Home"}  PCP:  Patient, No Pcp Per  Cardiologist:  No primary care provider on file. *** Electrophysiologist:  None   Evaluation Performed:  {Choose Visit Type:[phone]::"Follow-Up Visit"}  Chief Complaint:  ***  History of Present Illness:    Jennifer Mayer is a 34 y.o. female with  history of chest pain and palpitations who was last seen by Dr. Acie Fredrickson 03/24/2016.  Ms. Rosen initially saw Dr. Acie Fredrickson as a referral for chest discomfort at which time she described chest pain after recent diagnosis of strep throat which was relieved with Motrin therapy. She works as a Technical brewer at [employer] where an EKG was performed which showed questionable pericarditis. She did have pleuritic chest pain which worsened with lying back and improved with sitting forward.  She was started on prednisone therapy.  In follow-up, she continued to have chest pain therefore stress test Myoview was performed which was found to be normal and she was continued on colchicine 0.6 mg p.o. twice daily and Motrin 400 to 600 mg 3 times daily treated for pericarditis.   At last office visit in 2017 patient seemed to be doing great from a pericarditis standpoint. She had some mild diastolic dysfunction on echocardiogram during pericarditis work-up however she had no symptoms of diastolic dysfunction on clinical exam and therefore no need to repeat the echocardiogram.  She was last seen by myself for telemedicine visit 11/29/2019 at which time she was noticing an increased frequency of her palpitations. She reported that she was previously diagnosed with this and took metoprolol daily however did not like how it made her feel therefore she self discontinued. Her palpitations have been  very well controlled since that time without exacerbations however she states that she has been under more stress lately with work and feels that they have become more frequent. We discussed the plan of an as needed metoprolol dose however she did not like how this made her feel at follow-up we could possibly change to propanolol.  We will plan for a 2-week Zio patch for further monitoring and virtual follow-up in 1 month.  Otherwise, she denies shortness of breath, LE edema, orthopnea, PND, dizziness or syncope.  2-week Zio patch with no results as of 01/07/20   1.  Palpitations: -Reports history of palpitations for which she was prescribed metoprolol daily however she did not like how this made her feel therefore she self discontinued.  Reports her palpitations were stable for quite some time however has been having some increased stress in her life and therefore palpitations have become more frequent.  She has no associated symptoms. -Plan for as needed metoprolol tartrate 12.5 to 25 mg p.o. -ZIO monitor 2 weeks to assess for arrhythmia, AF -Virtual follow-up in 1 month -If metoprolol unsuccessful or patient unsatisfied, may try propanolol  2. Hx of pericarditis: -No recurrent symptoms  3.  Mild diastolic dysfunction: -Found per echocardiogram during pericarditis work-up -No clinical symptoms of DD therefore no need to repeat echocardiogram at this time      The patient {does/does not:200015} have symptoms concerning for COVID-19 infection (fever, chills, cough, or new shortness of breath).    Past Medical History:  Diagnosis Date  . Abnormal Pap smear   . Hx of cardiovascular stress test    Lexiscan Myoview (  12/15):  EF 75%, breast atten, Low Risk  . Hx of echocardiogram    Echo (12/15):  EF 60-65%, no RWMA, no effusion  . Hx of varicella   . Thyroid nodule    Past Surgical History:  Procedure Laterality Date  . GYNECOLOGIC CRYOSURGERY    . LEEP    . WISDOM TOOTH  EXTRACTION       No outpatient medications have been marked as taking for the 01/14/20 encounter (Appointment) with Filbert Schilder, NP.     Allergies:   Sulfa antibiotics, Flagyl [metronidazole hcl], and Morphine and related   Social History   Tobacco Use  . Smoking status: Never Smoker  . Smokeless tobacco: Never Used  Substance Use Topics  . Alcohol use: No    Alcohol/week: 0.0 standard drinks  . Drug use: No     Family Hx: The patient's family history includes Heart attack in her maternal grandfather; Hypertension in her father and mother. There is no history of Other or Stroke.  ROS:   Please see the history of present illness.    *** All other systems reviewed and are negative.   Prior CV studies:   The following studies were reviewed today:  ***  Labs/Other Tests and Data Reviewed:    EKG:  {EKG/Telemetry Strips Reviewed:[phone]}  Recent Labs: No results found for requested labs within last 8760 hours.   Recent Lipid Panel No results found for: CHOL, TRIG, HDL, CHOLHDL, LDLCALC, LDLDIRECT  Wt Readings from Last 3 Encounters:  11/29/19 172 lb (78 kg)  05/26/17 183 lb (83 kg)  04/27/17 181 lb (82.1 kg)     Objective:    Vital Signs:  There were no vitals taken for this visit.   {[facility] Virtual Exam (Optional):[phone]::"VITAL SIGNS:  reviewed"}  ASSESSMENT & PLAN:    1. ***  COVID-19 Education: The signs and symptoms of COVID-19 were discussed with the patient and how to seek care for testing (follow up with PCP or arrange E-visit).  ***The importance of social distancing was discussed today.  Time:   Today, I have spent *** minutes with the patient with telehealth technology discussing the above problems.     Medication Adjustments/Labs and Tests Ordered: Current medicines are reviewed at length with the patient today.  Concerns regarding medicines are outlined above.   Tests Ordered: No orders of the defined types were placed in  this encounter.   Medication Changes: No orders of the defined types were placed in this encounter.   Follow Up:  {F/U [phone]} {follow up:15908}  Signed, Georgie Chard, NP  01/04/2020 4:53 PM    [facility]

## 2020-01-14 ENCOUNTER — Telehealth: Payer: Managed Care, Other (non HMO) | Admitting: Cardiology

## 2020-01-26 NOTE — Progress Notes (Signed)
Virtual Visit via Telephone Note   This visit type was conducted due to national recommendations for restrictions regarding the COVID-19 Pandemic (e.g. social distancing) in an effort to limit this patient's exposure and mitigate transmission in our community.  Due to her co-morbid illnesses, this patient is at least at moderate risk for complications without adequate follow up.  This format is felt to be most appropriate for this patient at this time.  The patient did not have access to video technology/had technical difficulties with video requiring transitioning to audio format only (telephone).  All issues noted in this document were discussed and addressed.  No physical exam could be performed with this format.  Please refer to the patient's chart for her  consent to telehealth for [facility].   The patient was identified using 2 identifiers.  Date:  01/28/2020   ID:  Jennifer, Mayer 1986-05-10, MRN [id number]  Patient Location: Home Provider Location: Office  PCP:  Patient, No Pcp Per  Cardiologist:  Dr. Elease Hashimoto, MD   Evaluation Performed:  Follow-Up Visit  Chief Complaint:  Palpitations   History of Present Illness:    Jennifer Mayer is a 34 y.o. female who presents for 1 month follow-up for chest pain and palpitations, seen for Dr. Elease Hashimoto.   Jennifer Mayer initially saw Dr. Elease Hashimoto as a referral for chest discomfort at which time she described chest pain after recent diagnosis of strep throat which was relieved with Motrin therapy. 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 She did have pleuritic chest pain which worsened with lying back and improved with sitting forward.  She was started on prednisone therapy. In follow-up, she continued to have chest pain therefore stress test Myoview was performed which was found to be normal and she was continued on colchicine 0.6 mg p.o. twice daily and Motrin 400 to 600 mg 3  times daily treated for pericarditis.   At last office visit in 2017 patient seemed to be doing great from a pericarditis standpoint. She had some mild diastolic dysfunction on echocardiogram during pericarditis work-up however she had no symptoms of diastolic dysfunction on clinical exam and therefore no need to repeat the echocardiogram.  She was last seen by myself 11/29/2019 and reported increased frequency of palpitations today. She reported that she was previously diagnosed with this and took metoprolol daily however did not like how it made her feel therefore she self discontinued.  Her palpitations had  been very well controlled since that time without exacerbations however she states that she has been under more stress with work and felt that they had become more frequent.    ZIO monitor showed NSR and rare PVCs 01/17/2020. Today [name] states that the as needed metoprolol has been working well for her.  She states she did not take any metoprolol during monitor evaluation to assess full episodes and has only taken 12.5 mg on 2-3 separate occasions.  Denies chest pain, palpitations, shortness of breath, LE edema or orthopnea symptoms.   The patient does not have symptoms concerning for COVID-19 infection (fever, chills, cough, or new shortness of breath).   Past Medical History:  Diagnosis Date  . Abnormal Pap smear   . Hx of cardiovascular stress test    Lexiscan Myoview (12/15):  EF 75%, breast atten, Low Risk  . Hx of echocardiogram    Echo (12/15):  EF 60-65%, no RWMA, no effusion  .  Hx of varicella   . Thyroid nodule    Past Surgical History:  Procedure Laterality Date  . GYNECOLOGIC CRYOSURGERY    . LEEP    . WISDOM TOOTH EXTRACTION       Current Meds  Medication Sig  . famotidine (PEPCID) 40 MG tablet Take 40 mg by mouth 2 (two) times daily as needed.  . metoprolol tartrate (LOPRESSOR) 25 MG tablet Take 0.5-1 tablets (12.5-25 mg total) by mouth daily as needed  (palpitations).  Marland Kitchen omeprazole (PRILOSEC) 40 MG capsule Take 40 mg by mouth daily.     Allergies:   Sulfa antibiotics, Flagyl [metronidazole hcl], and Morphine and related   Social History   Tobacco Use  . Smoking status: Never Smoker  . Smokeless tobacco: Never Used  Substance Use Topics  . Alcohol use: No    Alcohol/week: 0.0 standard drinks  . Drug use: No     Family Hx: The patient's family history includes Heart attack in her maternal grandfather; Hypertension in her father and mother. There is no history of Other or Stroke.  ROS:   Please see the history of present illness.     All other systems reviewed and are negative.  Prior CV studies:   The following studies were reviewed today:  Monitor 01/17/20:   Sinus rhythm  Rare PVCs ( benign)  Labs/Other Tests and Data Reviewed:    EKG:  No ECG reviewed.  Recent Labs: No results found for requested labs within last 8760 hours.   Recent Lipid Panel No results found for: CHOL, TRIG, HDL, CHOLHDL, LDLCALC, LDLDIRECT  Wt Readings from Last 3 Encounters:  01/28/20 180 lb (81.6 kg)  11/29/19 172 lb (78 kg)  05/26/17 183 lb (83 kg)     Objective:    Vital Signs:  Ht 5\' 8"  (1.727 m)   Wt 180 lb (81.6 kg)   BMI 27.37 kg/m    VITAL SIGNS:  reviewed GEN:  no acute distress NEURO:  alert and oriented x 3, no obvious focal deficit PSYCH:  normal affect  ASSESSMENT & PLAN:    1.  Palpitations: -Reports improvement with as needed metoprolol 12.5-25 mg p.o.  -States that she is only needed her dose approximately 2-3 times after wearing the monitor.   -We will continue and follow-up in 1 year or before if needed.   2. Hx of pericarditis: -No recurrent symptoms  3.  Mild diastolic dysfunction: -Found per echocardiogram during pericarditis work-up -No clinical symptoms of DD therefore no need to repeat echocardiogram at this time   COVID-19 Education: The signs and symptoms of COVID-19 were discussed with  the patient and how to seek care for testing (follow up with PCP or arrange E-visit).  The importance of social distancing was discussed today.  Time:   Today, I have spent 20 minutes with the patient with telehealth technology discussing the above problems.     Medication Adjustments/Labs and Tests Ordered: Current medicines are reviewed at length with the patient today.  Concerns regarding medicines are outlined above.   Tests Ordered: No orders of the defined types were placed in this encounter.   Medication Changes: No orders of the defined types were placed in this encounter.   Follow Up:  Either In Person or Virtual Dr. Acie Fredrickson in 1 year  Signed, Kathyrn Drown, NP  01/28/2020 11:56 AM    [place]

## 2020-01-28 ENCOUNTER — Telehealth (INDEPENDENT_AMBULATORY_CARE_PROVIDER_SITE_OTHER): Payer: Managed Care, Other (non HMO) | Admitting: Cardiology

## 2020-01-28 ENCOUNTER — Other Ambulatory Visit: Payer: Self-pay

## 2020-01-28 ENCOUNTER — Encounter: Payer: Self-pay | Admitting: Cardiology

## 2020-01-28 VITALS — Ht 68.0 in | Wt 180.0 lb

## 2020-01-28 DIAGNOSIS — Z8679 Personal history of other diseases of the circulatory system: Secondary | ICD-10-CM | POA: Diagnosis not present

## 2020-01-28 DIAGNOSIS — R002 Palpitations: Secondary | ICD-10-CM

## 2020-01-28 NOTE — Patient Instructions (Signed)
Medication Instructions:   Your physician recommends that you continue on your current medications as directed. Please refer to the Current Medication list given to you today.  *If you need a refill on your cardiac medications before your next appointment, please call your pharmacy*  Lab Work:  None ordered today  Testing/Procedures:  None ordered today  Follow-Up: At [facility], you and your health needs are our priority.  As part of our continuing mission to provide you with exceptional heart care, we have created designated Provider Care Teams.  These Care Teams include your primary Cardiologist (physician) and Advanced Practice Providers (APPs -  Physician Assistants and Nurse Practitioners) who all work together to provide you with the care you need, when you need it.  We recommend signing up for the patient portal called "MyChart".  Sign up information is provided on this After Visit Summary.  MyChart is used to connect with patients for Virtual Visits (Telemedicine).  Patients are able to view lab/test results, encounter notes, upcoming appointments, etc.  Non-urgent messages can be sent to your provider as well.   To learn more about what you can do with MyChart, go to [email or web address].    Your next appointment:   12 month(s)  The format for your next appointment:   In Person  Provider:   You may see Kristeen Miss, MD or one of the following Advanced Practice Providers on your designated Care Team:    Tereso Newcomer, PA-C  [place], [place]

## 2022-01-26 ENCOUNTER — Other Ambulatory Visit (HOSPITAL_BASED_OUTPATIENT_CLINIC_OR_DEPARTMENT_OTHER): Payer: Self-pay

## 2022-01-26 MED ORDER — WEGOVY 0.25 MG/0.5ML ~~LOC~~ SOAJ
SUBCUTANEOUS | 0 refills | Status: AC
  Filled 2022-01-26: qty 2, 28d supply, fill #0

## 2022-01-27 ENCOUNTER — Other Ambulatory Visit (HOSPITAL_BASED_OUTPATIENT_CLINIC_OR_DEPARTMENT_OTHER): Payer: Self-pay

## 2022-01-28 ENCOUNTER — Ambulatory Visit: Payer: 59 | Admitting: Cardiovascular Disease

## 2022-01-28 ENCOUNTER — Other Ambulatory Visit (HOSPITAL_BASED_OUTPATIENT_CLINIC_OR_DEPARTMENT_OTHER): Payer: Self-pay

## 2022-01-28 ENCOUNTER — Encounter: Payer: Self-pay | Admitting: Cardiovascular Disease

## 2022-01-28 VITALS — BP 118/78 | HR 87 | Ht 68.5 in | Wt 199.8 lb

## 2022-01-28 DIAGNOSIS — R002 Palpitations: Secondary | ICD-10-CM

## 2022-01-28 DIAGNOSIS — I071 Rheumatic tricuspid insufficiency: Secondary | ICD-10-CM

## 2022-01-28 DIAGNOSIS — I493 Ventricular premature depolarization: Secondary | ICD-10-CM

## 2022-01-28 NOTE — Patient Instructions (Signed)
Medication Instructions:  ?Your physician recommends that you continue on your current medications as directed. Please refer to the Current Medication list given to you today. ? ?*If you need a refill on your cardiac medications before your next appointment, please call your pharmacy* ? ?Follow-Up: ?At [facility], you and your health needs are our priority.  As part of our continuing mission to provide you with exceptional heart care, we have created designated Provider Care Teams.  These Care Teams include your primary Cardiologist (physician) and Advanced Practice Providers (APPs -  Physician Assistants and Nurse Practitioners) who all work together to provide you with the care you need, when you need it. ? ?Your next appointment:   ?1-2 year(s) ? ?The format for your next appointment:   ?In Person ? ?Provider:   ?Chelsea Aus, PA-C, Jari Favre, PA-C, Ronie Spies, PA-C, Robin Searing, NP, Nada Boozer, NP, Jacolyn Reedy, PA-C, Eligha Bridegroom, NP, Tereso Newcomer, PA-C, or Cyndi Bender, NP   ? ?Important Information About Sugar ? ? ? ? ?  ? ? ?Adopting a Healthy Lifestyle. ?  ?Weight: Know what a healthy weight is for you (roughly BMI <25) and aim to maintain this. You can calculate your body mass index on your smart phone ? ?Diet: Aim for 7+ servings of fruits and vegetables daily ?Limit animal fats in diet for cholesterol and heart health - choose grass fed whenever available ?Avoid highly processed foods (fast food burgers, tacos, fried chicken, pizza, hot dogs, french fries)  ?Saturated fat comes in the form of butter, lard, coconut oil, margarine, partially hydrogenated oils, and fat in meat. These increase your risk of cardiovascular disease.  ?Use healthy plant oils, such as olive, canola, soy, corn, sunflower and peanut.  ?Whole foods such as fruits, vegetables and whole grains have fiber  ?Men need > 38 grams of fiber per day ?Women need > 25 grams of fiber per day  ?Load up on vegetables and fruits - one-half  of your plate: Aim for color and variety, and remember that potatoes don?t count. ?Go for whole grains - one-quarter of your plate: Whole wheat, barley, wheat berries, quinoa, oats, brown rice, and foods made with them. If you want pasta, go with whole wheat pasta. ?Protein power - one-quarter of your plate: Fish, chicken, beans, and nuts are all healthy, versatile protein sources. Limit red meat. ?You need carbohydrates for energy! The type of carbohydrate is more important than the amount. Choose carbohydrates such as vegetables, fruits, whole grains, beans, and nuts in the place of white rice, white pasta, potatoes (baked or fried), macaroni and cheese, cakes, cookies, and donuts.  ?If you?re thirsty, drink water. Coffee and tea are good in moderation, but skip sugary drinks and limit milk and dairy products to one or two daily servings. ?Keep sugar intake at 6 teaspoons or 24 grams or LESS  ? ? ? ?  ?Exercise: Aim for 150 min of moderate intensity exercise weekly for heart health, and weights twice weekly for bone health ?Stay active - any steps are better than no steps! ?Aim for 7-9 hours of sleep daily ?  ? ? ? ?   ?

## 2022-01-28 NOTE — Progress Notes (Signed)
? ? ? ?Jennifer Mayer ?Date of Birth  [birth year] ?      ?[facility] ?[address], [address]  [address], [address] ?[place], [place]  [address]   [place], [place]  [address] ?[phone]     [phone]   ?Fax  [phone]     Fax [phone] ? ?Problem List: ?1. Chest discomfort ? ?History of Present Illness: ? 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   She was originally diagnosed with strep throat. ?She has had chest discomfort off and on since that time.  Occasionally takes Motrin with relief.  ?She saw De. Nodi on Sept.   She tried Celexa and valium but stopped  ?ECG at work - ? Possible pericarditis ?+ pleuretic chest pain, worse with lying back, better with sitting up  ? ?No fevers or chills,  ?No arthritis symptoms ?No bleeding in her stool ? ?She went to the ER on 1 occasion for CP and NS ST changes ? D-dimer was +, CT angio was negative for PE. ?She was started on Prednisone. ?Still have some soreness - although its much better.  ?Was on prednisone for a while, now has tapered off.  ? ?Sharp , stabbing CP,  Typically in the middle of her chest  ?Worse with lying down, better with sitting up ? ?Nonsmoker ?Occasional ETOH ?Fhx- mother and father have HTN ? ?Dec. 17, 2015: ?Jennifer Mayer is a 36 yo with pleuretic CP assumed to be pericarditis. ? ?She was seen by Lorin Picket several days ago. ?Still having some CP.  A stress myvoiew was done and is normal. ? ?The pain has improved quite a bit. ?Seems to be worse with movement.   ?Still on colchicine 0.6 BID and motrin 400-600 TID.  ? ?March 24, 2016:   ? 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  ?She's doing very well. She's not had any recurrent episodes of pain. She did have some GI issues earlier this year. ? ?Jan 28, 2022: ?Jennifer Mayer is seen today for follow-up visit.  She has a history of sinus tachycardia and pericarditis in the past. ?CMA for [employer] at [facility]  [place] , will be going to nursing school ( [employer])  ?Was seen by Georgie Chard, NP in May 2021 for palpitations  ?Event monitor showed PVCs  ? ? ? ? ? ? ? ?Current Outpatient Medications on File Prior to Visit  ?Medication Sig Dispense Refill  ? busPIRone (BUSPAR) 10 MG tablet Take 10 mg by mouth daily as needed.    ? cetirizine (ZYRTEC) 10 MG tablet Take 1 tablet by mouth daily.    ? famotidine (PEPCID) 40 MG tablet Take 40 mg by mouth 2 (two) times daily as needed.    ? fluticasone (FLONASE) 50 MCG/ACT nasal spray one spray by Both Nostrils route daily.    ? metoprolol tartrate (LOPRESSOR) 25 MG tablet Take 0.5-1 tablets (12.5-25 mg total) by mouth daily as needed (palpitations). 30 tablet 5  ? omeprazole (PRILOSEC) 40 MG capsule Take 40 mg by mouth daily.    ? dicyclomine (BENTYL) 20 MG tablet Take 1 tablet by mouth 3 (three) times daily. (Patient not taking: Reported on 01/28/2022)    ? LINZESS 145 MCG CAPS capsule Take 145 mcg by mouth daily. (Patient not taking: Reported on 01/28/2022)    ? Semaglutide-Weight Management (WEGOVY) 0.25  MG/0.5ML SOAJ Inject 0.5 mLs (0.25 mg dose) into the skin once a week for 28 days. (Patient not taking: Reported on 01/28/2022) 2 mL 0  ? ?No current facility-administered medications on file prior to visit.  ? ? ?Allergies  ?Allergen Reactions  ? Sulfa Antibiotics Other (See Comments)  ?  Leg pain  ? Flagyl [Metronidazole Hcl] Hives  ? Morphine And Related Itching  ?  Severe  ? ? ?Past Medical History:  ?Diagnosis Date  ? Abnormal Pap smear   ? Hx of cardiovascular stress test   ? Lexiscan Myoview (12/15):  EF 75%, breast atten, Low Risk  ? Hx of echocardiogram   ? Echo (12/15):  EF 60-65%, no RWMA, no effusion  ? Hx of varicella   ? Thyroid nodule   ? ? ?Past Surgical History:  ?Procedure Laterality Date  ? GYNECOLOGIC CRYOSURGERY    ? LEEP    ? WISDOM TOOTH EXTRACTION    ? ? ?Social History  ? ?Tobacco Use  ?Smoking Status Never  ?Smokeless Tobacco Never   ? ? ?Social History  ? ?Substance and Sexual Activity  ?Alcohol Use No  ? Alcohol/week: 0.0 standard drinks  ? ? ?Family History  ?Problem Relation Age of Onset  ? Hypertension Mother   ? Hypertension Father   ? Heart attack Maternal Grandfather   ? Other Neg Hx   ? Stroke Neg Hx   ? ? ?Reviw of Systems:  ?Reviewed in the HPI.  All other systems are negative. ? ? ?Physical Exam: ?Blood pressure 118/78, pulse 87, height 5' 8.5" (1.74 m), weight 199 lb 12.8 oz (90.6 kg), SpO2 99 %, unknown if currently breastfeeding. ? ?GEN:  Well nourished, well developed in no acute distress ?HEENT: Normal ?NECK: No JVD; No carotid bruits ?LYMPHATICS: No lymphadenopathy ?CARDIAC: RRR , soft systolic murmur,  no pericardial friction rub ?RESPIRATORY:  Clear to auscultation without rales, wheezing or rhonchi  ?ABDOMEN: Soft, non-tender, non-distended ?MUSCULOSKELETAL:  No edema; No deformity  ?SKIN: Warm and dry ?NEUROLOGIC:  Alert and oriented x 3 ? ? ?ECG: ?Jan 28, 2022: Normal sinus rhythm.  Possible left atrial enlargement.  Nonspecific T wave abnormality.  No changes from previous EKGs. ? ?Assessment / Plan:  ? ?1. Pericarditis:   She has a history of pericarditis in the past.  There is no evidence of recurrent pericarditis.  She does not have any symptoms. ? ? ?2. Mild diastolic dysfunction: Breathing is stable.  Of the sure to work on weight loss.  I have encouraged her not to take phentermine as well as brought up as a possible weight loss solution.  I have given her some information on carbohydrate reduction.  She needs to start exercising on a regular basis. ? ? ?3. Sinus tach:  ?Heart rate is well controlled. ? ?Kristeen Miss, MD  ?01/28/2022 8:39 AM    ?[facility] ?[address],  [address] ?[place], [place]  [address] ?Pager [phone] ?Phone: [phone]; Fax: [phone]  ? ? ?

## 2022-01-31 ENCOUNTER — Other Ambulatory Visit (HOSPITAL_BASED_OUTPATIENT_CLINIC_OR_DEPARTMENT_OTHER): Payer: Self-pay

## 2022-02-02 ENCOUNTER — Other Ambulatory Visit (HOSPITAL_BASED_OUTPATIENT_CLINIC_OR_DEPARTMENT_OTHER): Payer: Self-pay

## 2024-07-02 ENCOUNTER — Encounter: Payer: Self-pay | Admitting: Family Medicine

## 2024-07-02 DIAGNOSIS — Z1231 Encounter for screening mammogram for malignant neoplasm of breast: Secondary | ICD-10-CM

## 2024-07-24 ENCOUNTER — Other Ambulatory Visit: Payer: Self-pay | Admitting: Family Medicine

## 2024-07-24 DIAGNOSIS — Z1231 Encounter for screening mammogram for malignant neoplasm of breast: Secondary | ICD-10-CM

## 2024-08-09 ENCOUNTER — Other Ambulatory Visit: Payer: Self-pay | Admitting: Family Medicine

## 2024-08-09 DIAGNOSIS — N6019 Diffuse cystic mastopathy of unspecified breast: Secondary | ICD-10-CM

## 2024-08-20 ENCOUNTER — Ambulatory Visit

## 2024-09-14 ENCOUNTER — Other Ambulatory Visit: Payer: Self-pay | Admitting: Family Medicine

## 2024-09-14 DIAGNOSIS — N6019 Diffuse cystic mastopathy of unspecified breast: Secondary | ICD-10-CM

## 2024-09-16 ENCOUNTER — Encounter: Payer: Self-pay | Admitting: Family Medicine

## 2024-09-16 ENCOUNTER — Other Ambulatory Visit: Payer: Self-pay | Admitting: Family Medicine

## 2024-09-16 DIAGNOSIS — N6019 Diffuse cystic mastopathy of unspecified breast: Secondary | ICD-10-CM

## 2024-09-24 ENCOUNTER — Ambulatory Visit
Admission: RE | Admit: 2024-09-24 | Discharge: 2024-09-24 | Disposition: A | Discharge status: Home / Self Care | Source: Ambulatory Visit | Attending: Family Medicine | Admitting: Family Medicine

## 2024-09-24 DIAGNOSIS — N6019 Diffuse cystic mastopathy of unspecified breast: Secondary | ICD-10-CM
